# Patient Record
Sex: Female | Born: 1935 | Race: White | Hispanic: No | Smoking: Never smoker
Health system: Southern US, Community
[De-identification: ages and names within clinical notes are randomized; demographics above are authoritative.]

## PROBLEM LIST (undated history)

## (undated) DIAGNOSIS — I2510 Atherosclerotic heart disease of native coronary artery without angina pectoris: Secondary | ICD-10-CM

## (undated) DIAGNOSIS — E785 Hyperlipidemia, unspecified: Secondary | ICD-10-CM

## (undated) DIAGNOSIS — H9319 Tinnitus, unspecified ear: Secondary | ICD-10-CM

## (undated) DIAGNOSIS — H269 Unspecified cataract: Secondary | ICD-10-CM

## (undated) DIAGNOSIS — M79606 Pain in leg, unspecified: Secondary | ICD-10-CM

## (undated) DIAGNOSIS — K649 Unspecified hemorrhoids: Secondary | ICD-10-CM

## (undated) DIAGNOSIS — C801 Malignant (primary) neoplasm, unspecified: Secondary | ICD-10-CM

## (undated) DIAGNOSIS — E041 Nontoxic single thyroid nodule: Secondary | ICD-10-CM

## (undated) DIAGNOSIS — E039 Hypothyroidism, unspecified: Secondary | ICD-10-CM

## (undated) DIAGNOSIS — H3530 Unspecified macular degeneration: Secondary | ICD-10-CM

## (undated) DIAGNOSIS — K219 Gastro-esophageal reflux disease without esophagitis: Secondary | ICD-10-CM

## (undated) DIAGNOSIS — R51 Headache: Secondary | ICD-10-CM

## (undated) DIAGNOSIS — I8000 Phlebitis and thrombophlebitis of superficial vessels of unspecified lower extremity: Secondary | ICD-10-CM

## (undated) DIAGNOSIS — I209 Angina pectoris, unspecified: Secondary | ICD-10-CM

## (undated) DIAGNOSIS — J189 Pneumonia, unspecified organism: Secondary | ICD-10-CM

## (undated) DIAGNOSIS — J349 Unspecified disorder of nose and nasal sinuses: Secondary | ICD-10-CM

## (undated) DIAGNOSIS — M1990 Unspecified osteoarthritis, unspecified site: Secondary | ICD-10-CM

## (undated) DIAGNOSIS — J40 Bronchitis, not specified as acute or chronic: Secondary | ICD-10-CM

## (undated) DIAGNOSIS — K922 Gastrointestinal hemorrhage, unspecified: Secondary | ICD-10-CM

## (undated) DIAGNOSIS — F419 Anxiety disorder, unspecified: Secondary | ICD-10-CM

## (undated) DIAGNOSIS — R350 Frequency of micturition: Secondary | ICD-10-CM

## (undated) DIAGNOSIS — I10 Essential (primary) hypertension: Secondary | ICD-10-CM

## (undated) DIAGNOSIS — R29898 Other symptoms and signs involving the musculoskeletal system: Secondary | ICD-10-CM

## (undated) DIAGNOSIS — Z981 Arthrodesis status: Secondary | ICD-10-CM

## (undated) HISTORY — DX: Gastro-esophageal reflux disease without esophagitis: K21.9

## (undated) HISTORY — DX: Hyperlipidemia, unspecified: E78.5

## (undated) HISTORY — PX: HEMORRHOID SURGERY: SHX153

## (undated) HISTORY — PX: OTHER SURGICAL HISTORY: SHX169

## (undated) HISTORY — DX: Nontoxic single thyroid nodule: E04.1

## (undated) HISTORY — PX: LIPOMA EXCISION: SHX5283

## (undated) HISTORY — PX: UPPER GI ENDOSCOPY: SHX6162

---
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
## 1961-10-10 HISTORY — PX: OTHER SURGICAL HISTORY: SHX169

## 1964-10-10 DIAGNOSIS — Z981 Arthrodesis status: Secondary | ICD-10-CM

## 1964-10-10 HISTORY — DX: Arthrodesis status: Z98.1

## 1964-10-10 HISTORY — PX: CERVICAL FUSION: SHX112

## 1976-10-10 HISTORY — PX: CHOLECYSTECTOMY: SHX55

## 1976-10-10 HISTORY — PX: APPENDECTOMY: SHX54

## 2000-11-28 ENCOUNTER — Other Ambulatory Visit: Admission: RE | Admit: 2000-11-28 | Discharge: 2000-11-28 | Payer: Self-pay | Admitting: Obstetrics and Gynecology

## 2002-02-15 ENCOUNTER — Ambulatory Visit (HOSPITAL_COMMUNITY): Admission: RE | Admit: 2002-02-15 | Discharge: 2002-02-15 | Payer: Self-pay | Admitting: Endocrinology

## 2002-02-15 ENCOUNTER — Encounter: Payer: Self-pay | Admitting: Endocrinology

## 2002-05-03 ENCOUNTER — Encounter (INDEPENDENT_AMBULATORY_CARE_PROVIDER_SITE_OTHER): Payer: Self-pay | Admitting: Specialist

## 2002-05-03 ENCOUNTER — Encounter: Payer: Self-pay | Admitting: Endocrinology

## 2002-05-03 ENCOUNTER — Ambulatory Visit (HOSPITAL_COMMUNITY): Admission: RE | Admit: 2002-05-03 | Discharge: 2002-05-03 | Payer: Self-pay | Admitting: Endocrinology

## 2003-01-22 ENCOUNTER — Other Ambulatory Visit: Admission: RE | Admit: 2003-01-22 | Discharge: 2003-01-22 | Payer: Self-pay | Admitting: Obstetrics and Gynecology

## 2003-02-07 ENCOUNTER — Ambulatory Visit (HOSPITAL_COMMUNITY): Admission: RE | Admit: 2003-02-07 | Discharge: 2003-02-07 | Payer: Self-pay | Admitting: Gastroenterology

## 2003-02-10 ENCOUNTER — Ambulatory Visit (HOSPITAL_COMMUNITY): Admission: RE | Admit: 2003-02-10 | Discharge: 2003-02-10 | Payer: Self-pay | Admitting: Endocrinology

## 2003-02-10 ENCOUNTER — Encounter: Payer: Self-pay | Admitting: Endocrinology

## 2004-02-25 ENCOUNTER — Ambulatory Visit (HOSPITAL_COMMUNITY): Admission: RE | Admit: 2004-02-25 | Discharge: 2004-02-25 | Payer: Self-pay | Admitting: Endocrinology

## 2005-04-18 ENCOUNTER — Encounter (INDEPENDENT_AMBULATORY_CARE_PROVIDER_SITE_OTHER): Payer: Self-pay | Admitting: *Deleted

## 2005-04-19 ENCOUNTER — Inpatient Hospital Stay (HOSPITAL_COMMUNITY): Admission: RE | Admit: 2005-04-19 | Discharge: 2005-04-20 | Payer: Self-pay | Admitting: General Surgery

## 2005-05-20 ENCOUNTER — Other Ambulatory Visit: Admission: RE | Admit: 2005-05-20 | Discharge: 2005-05-20 | Payer: Self-pay | Admitting: Obstetrics and Gynecology

## 2006-03-22 ENCOUNTER — Ambulatory Visit (HOSPITAL_COMMUNITY): Admission: RE | Admit: 2006-03-22 | Discharge: 2006-03-22 | Payer: Self-pay | Admitting: Endocrinology

## 2006-08-18 ENCOUNTER — Encounter: Admission: RE | Admit: 2006-08-18 | Discharge: 2006-08-18 | Payer: Self-pay | Admitting: Obstetrics and Gynecology

## 2007-02-15 ENCOUNTER — Encounter: Admission: RE | Admit: 2007-02-15 | Discharge: 2007-02-15 | Payer: Self-pay | Admitting: Obstetrics and Gynecology

## 2007-03-23 ENCOUNTER — Encounter: Admission: RE | Admit: 2007-03-23 | Discharge: 2007-03-23 | Payer: Self-pay | Admitting: Endocrinology

## 2008-03-11 ENCOUNTER — Encounter: Admission: RE | Admit: 2008-03-11 | Discharge: 2008-03-11 | Payer: Self-pay | Admitting: Endocrinology

## 2009-05-13 ENCOUNTER — Encounter: Admission: RE | Admit: 2009-05-13 | Discharge: 2009-05-13 | Payer: Self-pay | Admitting: Endocrinology

## 2009-11-04 ENCOUNTER — Ambulatory Visit (HOSPITAL_COMMUNITY)
Admission: RE | Admit: 2009-11-04 | Discharge: 2009-11-04 | Payer: Self-pay | Source: Home / Self Care | Admitting: Obstetrics and Gynecology

## 2010-10-31 ENCOUNTER — Encounter: Payer: Self-pay | Admitting: Endocrinology

## 2011-02-25 NOTE — Op Note (Signed)
   NAME:  Sherry Olsen, Sherry Olsen                         ACCOUNT NO.:  [id number]   MEDICAL RECORD NO.:  [id number]                   PATIENT TYPE:  AMB   LOCATION:  ENDO                                 FACILITY:  [facility]   PHYSICIAN:  Bernette Redbird, M.D.                DATE OF BIRTH:  1933-10-09   DATE OF PROCEDURE:  DATE OF DISCHARGE:                                 OPERATIVE REPORT   PROCEDURE:  Colonoscopy.   INDICATIONS:  Strong family history of colon cancer in this 77 year old  female, whose father and maternal aunt both had colon cancer.  The patient  last had colonoscopy in [place] several years ago and in the past she has  had some small rectal polyps fulgurated, the histology being unknown since  no tissue was retrieved for histologic analysis.   FINDINGS:  Significant sigmoid diverticulosis, but no polyps.   DESCRIPTION OF PROCEDURE:  The risks of the procedure had been reviewed with  the patient, who provided written consent.  Sedation was fentanyl 80 mcg and  Versed 7 mg IV prior to and during the course of the procedure without  arrhythmias or desaturation.   The Olympus adjustable-tension pediatric video colonoscope was advanced  through an angulated, fixated sigmoid region with a lot of diverticular  disease, with mild to moderate difficulty, and then quite easily the  remainder of the way around the colon to the cecum, using external abdominal  compression to get the tip of the scope to advance into the base of the  cecum, whereupon pullback was initiated.  The quality of the prep was  excellent and it is felt that all areas were well-seen.   Apart from the above-mentioned sigmoid diverticular change and associated  fixation, this was a normal examination.  No polyps, cancer, or colitis was  observed.  There were no vascular malformations. Retroflexion in the rectum  as well as reinspection of the rectum was unremarkable.  No biopsies were  obtained.  The patient  tolerated the procedure well, and there were no  apparent complications.    IMPRESSION:  Sigmoid diverticulosis, otherwise negative colonoscopy in a  patient with a family history of colon cancer.   PLAN:  Screening colonoscopy again in five years.                                               Bernette Redbird, M.D.    RB/MEDQ  D:  02/07/2003  T:  02/07/2003  Job:  [id number]   cc:   Brooke Bonito, M.D.  [address]  [place]  [place] [address]  Fax: [phone]   Juluis Mire, M.D.  [address] [place]  [place] [address]  Fax: [phone]

## 2011-02-25 NOTE — Op Note (Signed)
NAME:  YEKATERINA, ESCUTIA NO.:  [id number]   MEDICAL RECORD NO.:  [id number]          PATIENT TYPE:  OIB   LOCATION:  2899                         FACILITY:  [facility]   PHYSICIAN:  Anselm Pancoast. Weatherly, M.D.DATE OF BIRTH:  [birth year]   DATE OF PROCEDURE:  04/18/2005  DATE OF DISCHARGE:                                 OPERATIVE REPORT   PREOPERATIVE DIAGNOSES:  1.  Internal and external hemorrhoids.  2.  Rectocele.  3.  Large cystocele.   OPERATION:  Internal and external hemorrhoidectomy.   ANESTHESIA:  General.   SURGEON:  Anselm Pancoast. Zachery Dakins, M.D.   HISTORY:  Ms. Jennea Rager is a 77 year old female who has long history of  kind of bowel irregularity, constipation, diarrhea, spastic colon-type  symptoms and has a pretty significant rectocele and cystocele.  She still  has her uterus and it is kind of retroflexed, and at some time she is going  to need a repair of the rectocele, cystocele, but she wants to proceed on  with an internal, external hemorrhoidectomy because of significant problems  with intermittent bleeding with these hemorrhoids that prolapse.  I saw her  in the office and it was certainly much more disease than any type of  internal or office procedure could manage.  Recommended we proceed on with a  hemorrhoidectomy.  She is aware that the rectocele and cystocele is not  being addressed at this time.  She is followed by Dr. Richardean Chimera who has  seen her in the past week and am not sure what or when they are planning the  gyn procedure.  Preoperatively, the patient because of her multiple  allergies was given a gram of vancomycin and after this was completed, she  was taken to the operative suite.   She has had a previous complete neck fusion and I think because of the  rectocele and cystocele that it is necessary that she be completely  paralyzed and not an LOA tube, so it was some time getting the tube by the  anesthesiologist.  They used a  fiberoptic and other tricks, and then kind of  a rapid intubation exchanging the tubes, etc.  Their portion is noted on the  anesthesia record.  After the good airway had been established, she was then  placed up into the candy cane stirrups. I was very careful not to let her  hips be overflexed or well protected with pillows, etc., and the ankles and  feet protected with towels.  In this position, the hemorrhoids prolapsed out  significantly, and we prepped her including a vaginal prep with Betadine  scrub, saline, and then also Betadine solution.  Next, she was draped in the  sterile manner with the legs, etc.  Next, after first inspecting I went  ahead and anesthetized the perianal area with approximately 20 mL of 0.5%  Marcaine with adrenaline.  I cautiously dilated her anus.  She does not  really have an anal stenosis and I did not want to dilate her up too much  because of her laxity of the whole floor, etc.  In this position, I was  sitting with an operative tray in my lap.  Went ahead and did a careful  inspection and elected to do the anterior right internal and external  hemorrhoid complex first taking real care not to go up real high and making  sure that all the stitches were definitely in the mucosa and not a deep  stitch that could possibly head into vaginal area because of the very thin  rectal wall.  Then, the hemorrhoid had been elevated with Buie clamp.  The  internal sphincter was visualized, but not injured.  The hemorrhoid removed.  A little superficial external hemorrhoid area was sutured with 3-0 Chromic,  and then the hemorrhoidectomy sutured over the clamp a couple of U stitches  were then placed first of 2-0 Chromic in the external portion of the  hemorrhoidectomy was running closed with a suture.  Next, a similar right  posterior lateral complex was elevated in a similar manner, and this was  sutured using the Buie clamp, and then removing the Buie clamp fairly  early.  Good hemostasis, and then locking running sutures of the internal and  external portion.  The left lateral hemorrhoid was the smaller one, and it  was elevated off the sphincter in the similar manner.  There is a little bit  of excess tissue right in the posterior external, but I wanted to leave that  because of difficulty healing if you remove that because of the closeness of  the two lateral incisions.  The suture line was inspected.  Good hemostasis.  2% Xylocaine ointment over gauze corner was placed in the anal canal, and  then 4 x 4's over it and held in place with stretch panties.  I plan to keep  her overnight and do an in and out catheterization if there is any  difficulty in voiding, and then release her in the morning.  I inspected her  at the completion of surgery, inspection of her vagina, and could not feel  any of the suture lines, etc.       WJW/MEDQ  D:  04/18/2005  T:  04/18/2005  Job:  [id number]   cc:   Juluis Mire, M.D.  [address] [place]  [place] [address]  Fax: [phone]  [address]  [place]  [place] [address]  Fax: [phone]

## 2012-01-31 ENCOUNTER — Other Ambulatory Visit (HOSPITAL_COMMUNITY): Payer: Self-pay | Admitting: Endocrinology

## 2012-01-31 DIAGNOSIS — E041 Nontoxic single thyroid nodule: Secondary | ICD-10-CM

## 2012-02-03 ENCOUNTER — Ambulatory Visit (HOSPITAL_COMMUNITY)
Admission: RE | Admit: 2012-02-03 | Discharge: 2012-02-03 | Disposition: A | Discharge status: Home / Self Care | Payer: Medicare Other | Source: Ambulatory Visit | Attending: Endocrinology | Admitting: Endocrinology

## 2012-02-03 DIAGNOSIS — E042 Nontoxic multinodular goiter: Secondary | ICD-10-CM | POA: Insufficient documentation

## 2012-02-03 DIAGNOSIS — E041 Nontoxic single thyroid nodule: Secondary | ICD-10-CM

## 2012-05-01 ENCOUNTER — Other Ambulatory Visit (HOSPITAL_COMMUNITY): Payer: Self-pay | Admitting: Endocrinology

## 2012-05-01 DIAGNOSIS — E041 Nontoxic single thyroid nodule: Secondary | ICD-10-CM

## 2012-05-03 ENCOUNTER — Other Ambulatory Visit: Payer: Self-pay | Admitting: Radiology

## 2012-05-07 ENCOUNTER — Ambulatory Visit (HOSPITAL_COMMUNITY)
Admission: RE | Admit: 2012-05-07 | Discharge: 2012-05-07 | Disposition: A | Discharge status: Home / Self Care | Payer: Medicare Other | Source: Ambulatory Visit | Attending: Endocrinology | Admitting: Endocrinology

## 2012-05-07 DIAGNOSIS — E041 Nontoxic single thyroid nodule: Secondary | ICD-10-CM | POA: Insufficient documentation

## 2012-05-07 NOTE — Procedures (Signed)
Increasing size of left thyroid nodule. Last biopsy performed 2003. Not responding to synthroid.   Procedure : US guided 25 g FNA x 3 of dominant left thyroid nodule.  Complications : small amount of bleeding around nodule - controlled with pressure and ice. Patient tolerated well with no impact on swallowing or airway compromise.   Patient informed to not take her 81 mg asa x 2 days and to continue ice packs, slight pressure today and tomorrow. Full report in canopy.

## 2012-06-19 ENCOUNTER — Encounter (INDEPENDENT_AMBULATORY_CARE_PROVIDER_SITE_OTHER): Payer: Self-pay | Admitting: General Surgery

## 2012-06-21 ENCOUNTER — Encounter (INDEPENDENT_AMBULATORY_CARE_PROVIDER_SITE_OTHER): Payer: Self-pay | Admitting: General Surgery

## 2012-06-21 ENCOUNTER — Encounter (INDEPENDENT_AMBULATORY_CARE_PROVIDER_SITE_OTHER): Payer: Self-pay

## 2012-06-21 ENCOUNTER — Ambulatory Visit (INDEPENDENT_AMBULATORY_CARE_PROVIDER_SITE_OTHER): Payer: Medicare Other | Admitting: General Surgery

## 2012-06-21 VITALS — BP 132/72 | HR 80 | Temp 98.6°F | Resp 18 | Ht 61.0 in | Wt 133.0 lb

## 2012-06-21 DIAGNOSIS — E042 Nontoxic multinodular goiter: Secondary | ICD-10-CM

## 2012-06-21 NOTE — Patient Instructions (Signed)
You have multiple thyroid nodules, and most likely you have a benign, nontoxic multinodular goiter.  Because of the large nodule on the left, and because it has slowly enlarged, the recommendation is elective surgery to have the left thyroid lobe and isthmus removed. We would remove the right lobe only if cancer were proven.that might require a second operation at a later date.  You'll be scheduled for left thyroid lobectomy in the near future.    Thyroidectomy Thyroidectomy is the removal of part or all of your thyroid gland. Your thyroid gland is a butterfly-shaped gland at the base of your neck. It produces a substance called thyroid hormone, which regulates the physical and chemical processes that keep your body functioning and make energy available to your body (metabolism). The amount of thyroid gland tissue that is removed during a thyroidectomy depends on the reason for the procedure. Typically, if only a part of your gland is removed, enough thyroid gland tissue remains to maintain normal function. If your entire thyroid gland is removed or if the amount of thyroid gland tissue remaining is inadequate to maintain normal function, you will need life-long treatment with thyroid hormone on a daily basis. Thyroidectomy maybe performed when you have the following conditions:  Thyroid nodules. These are small, abnormal collections of tissue that form inside the thyroid gland. If these nodules begin to enlarge at a rapid rate, a sample of tissue from the nodule is taken through a needle and examined (needle biopsy). This is done to determine if the nodules are cancerous. Depending on the outcome of this exam, thyroidectomy may be necessary.   Thyroid cancer.   Goiter, which is an enlarged thyroid gland. All or part of the thyroid gland may be removed if the gland has become so large that it causes difficulty breathing or swallowing.   Hyperthyroidism. This is when the thyroid gland produces too  much thyroid hormone. Hypothyroidism can cause symptoms of fluctuating weight, intolerance to heat, irritability, shortness of breath, and chest pain.  LET YOUR CAREGIVER KNOW ABOUT:   Allergies to food or medicine.   Medicines that you are taking, including vitamins, herbs, eyedrops, over-the-counter medicines, and creams.   Previous problems you have had with anesthetics or numbing medicines.   History of bleeding problems or blood clots.   Previous surgeries you have had.   Other health problems, including diabetes and kidney problems, you have had.   Possibility of pregnancy, if this applies.  BEFORE THE PROCEDURE   Do not eat or drink anything, including water, for at least 6 hours before the procedure.   Ask your caregiver whether you should stop taking certain medicines before the day of the procedure.  PROCEDURE  There are different ways that thyroidectomy is performed. For each type, you will be given a medicine to make you sleep (general anesthetic). The three main types of thyroidectomy are listed as follows:  Conventional thyroidectomy-A cut (incision) in the center portion of your lower neck is made with a scalpel. Muscles below your skin are separated to gain access to your thyroid gland. Your thyroid gland is dissected from your windpipe (trachea). Often a drain is placed at the incision site to drain any blood that accumulates under the skin after the procedure. This drain will be removed before you go home. The wound from the incision should heal within 2 weeks.   Endoscopic thyroidectomy-Small incisions are made in your lower neck. A small instrument (endoscope) is inserted under your skin at the  incision sites. The endoscope used for thyroidectomy consists of 2 flexible tubes. Inside one of the tubes is a video camera that is used to guide the Careers adviser. Tools to remove the thyroid gland, including a tool to cut the gland (dissectors) and a suction device, are inserted  through the other tube. The surgeon uses the dissectors to dissect the thyroid gland from the trachea and remove it.   Robotic thyroidectomy-This procedure allows your thyroid gland to be removed through incisions in your armpit, your chest, or high in your neck. Instruments similar to endoscopes provide a 3-dimensional picture of the surgical site. Dissecting instruments are controlled by devices similar to joysticks. These devices allow more accurate manipulation of the instruments. After the blood supply to the gland is removed, the gland is cut into several pieces and removed through the incisions.  RISKS AND COMPLICATIONS Complications associated with thyroidectomy are rare, but they can occur. Possible complications include:  A decrease in parathyroid hormone levels (hypoparathyroidism)-Your parathyroid glands are located close behind your thyroid gland. They are responsible for maintaining calcium levels inthe body. If they are damaged or removed, levels of calcium in the blood become low and nerves become irritable, which can cause muscle spasms. Medicines are available to treat this.   Bacterial infection-This can often be treated with medicines that kill bacteria (antibiotics).   Damage to your voice box nerves-This could cause hoarseness or complete loss of voice.   Bleeding or airway obstruction.  AFTER THE PROCEDURE   You will rest in the recovery room as you wake up.   When you first wake up, your throat may feel slightly sore.   You will not be allowed to eat or drink until instructed otherwise.   You will be taken to your hospital room. You will usually stay at the hospital for 1 or 2 nights.   If a drain is placed during the procedure, it usually is removed the next day.   You may have some mild neck pain.   Your voice may be weak. This usually is temporary.  Document Released: 03/22/2001 Document Revised: 09/15/2011 Document Reviewed: 12/29/2010 [facility] Patient  Information 2012 [place], [place].

## 2012-06-21 NOTE — Progress Notes (Signed)
Patient ID: Sherry Olsen, female   DOB: 1933/06/13, 78 y.o.   MRN: [id number]  Chief Complaint  Patient presents with  . Thyroid Nodule    HPI Sherry Olsen is a 78 y.o. female.  She is referred by Dr. Adela Lank because of multiple thyroid nodules, and a dominant, enlarging thyroid nodule on the left that is 4.9 cm.  The patient states she has been told she had a goiter since she was a child. She says that a [place] chiropractor painted  iodine on her neck when she was a child. She was evaluated for thyroid disease in 1992 at [facility] clinic but nothing further was done. She denies history of radiation therapy to the neck. She had fine-needle aspiration in 2003. She's had a cervical fusion from the axis to C4.  She is followed by Dr. Juleen China and Dr. Arelia Sneddon. Ultrasound was performed on 02/03/2012 and compared to an ultrasound performed 11/06/2009,  she has multiple bilateral nodules. The largest nodule was on the left side and this 49 x 35 mm about 10% increase in size. The isthmus contains an 18 mm nodule. The nodules on the right are small. Thyroid function tests and chemistries are normal.she has no pressure symptoms, voice change, or swallowing problems.  Ultrasound guided FNA of the left thyroid nodule performed on July 29 shows follicular epithelium. Considerations were hyperplasia and adenoma. There was no atypia.  Family history is negative for thyroid cancer or MEN syndromes.  She is pretty healthy. She has irritable bowel syndrome and occasionally takes MiraLAX. She is on Synthroid. She takes Crestor for hyperlipidemia. HPI  Past Medical History  Diagnosis Date  . Thyroid nodule   . Hyperlipidemia   . GERD (gastroesophageal reflux disease)     Past Surgical History  Procedure Date  . Hemorrhoid surgery   . Cervical fusion   . Cholecystectomy   . Hemorrhoid surgery   . Tonsillectomy     No family history on file.  Social History History  Substance Use Topics  .  Smoking status: Never Smoker   . Smokeless tobacco: Not on file  . Alcohol Use: Yes    Allergies  Allergen Reactions  . Penicillins Anaphylaxis  . Ciprocinonide (Fluocinolone) Hives  . Fosamax (Alendronate Sodium)   . Keflex (Cephalexin)   . Sulfa Antibiotics Hives  . Tetracyclines & Related     Current Outpatient Prescriptions  Medication Sig Dispense Refill  . acetaminophen (TYLENOL) 500 MG tablet Take 1,000 mg by mouth every 6 (six) hours as needed. For pain      . aspirin 81 MG chewable tablet Chew 81 mg by mouth every morning.      Marland Kitchen atorvastatin (LIPITOR) 10 MG tablet Take 5 mg by mouth every morning.      . calcitonin, salmon, (MIACALCIN/FORTICAL) 200 UNIT/ACT nasal spray Place 1 spray into the nose daily.      . calcium carbonate (TUMS EX) 750 MG chewable tablet Chew 1 tablet by mouth as needed. For indigestion      . Calcium Citrate-Vitamin D (CITRACAL + D PO) Take 2 tablets by mouth 2 (two) times daily.      . Carboxymethylcell-Hypromellose (GENTEAL) 0.25-0.3 % GEL Apply 1 drop to eye at bedtime.      . cycloSPORINE (RESTASIS) 0.05 % ophthalmic emulsion Place 1 drop into both eyes 2 (two) times daily.      Marland Kitchen dicyclomine (BENTYL) 10 MG capsule Take 10 mg by mouth every morning.      Marland Kitchen  fish oil-omega-3 fatty acids 1000 MG capsule Take 1 g by mouth daily.      . lansoprazole (PREVACID) 30 MG capsule Take 60 mg by mouth every morning.      Marland Kitchen levothyroxine (SYNTHROID, LEVOTHROID) 75 MCG tablet Take 75 mcg by mouth daily before breakfast.      . OVER THE COUNTER MEDICATION Take 1 tablet by mouth every morning. Ocular Nutrition with Lutein eye vitamin      . Polyethylene Glycol 3350 (MIRALAX PO) Take by mouth. Confirm frequency with patient.        Review of Systems Review of Systems  Constitutional: Negative for fever, chills and unexpected weight change.  HENT: Negative for hearing loss, congestion, sore throat, trouble swallowing, neck pain and voice change.   Eyes:  Negative for visual disturbance.  Respiratory: Negative for cough and wheezing.   Cardiovascular: Negative for chest pain, palpitations and leg swelling.  Gastrointestinal: Negative for nausea, vomiting, abdominal pain, diarrhea, constipation, blood in stool, abdominal distention and anal bleeding.  Genitourinary: Negative for hematuria, vaginal bleeding and difficulty urinating.  Musculoskeletal: Negative for arthralgias.  Skin: Negative for rash and wound.  Neurological: Negative for seizures, syncope and headaches.  Hematological: Negative for adenopathy. Does not bruise/bleed easily.  Psychiatric/Behavioral: Negative for confusion.    Blood pressure 132/72, pulse 80, temperature 98.6 F (37 C), resp. rate 18, height 5\' 1"  (1.549 m), weight 133 lb (60.328 kg).  Physical Exam Physical Exam  Constitutional: She is oriented to person, place, and time. She appears well-developed and well-nourished. No distress.  HENT:  Head: Normocephalic and atraumatic.  Nose: Nose normal.  Mouth/Throat: No oropharyngeal exudate.  Eyes: Conjunctivae normal and EOM are normal. Pupils are equal, round, and reactive to light. Left eye exhibits no discharge. No scleral icterus.  Neck: Neck supple. No JVD present. No tracheal deviation present. No thyromegaly present.       Posterior neck incision from spinal fusion. Thyroid exam reveals fullness on the left but no discrete nodules, no goiter. No adenopathy.  Cardiovascular: Normal rate, regular rhythm, normal heart sounds and intact distal pulses.   No murmur heard. Pulmonary/Chest: Effort normal and breath sounds normal. No respiratory distress. She has no wheezes. She has no rales. She exhibits no tenderness.  Abdominal: Soft. Bowel sounds are normal. She exhibits no distension and no mass. There is no tenderness. There is no rebound and no guarding.  Musculoskeletal: She exhibits no edema and no tenderness.  Lymphadenopathy:    She has no cervical  adenopathy.  Neurological: She is alert and oriented to person, place, and time. She exhibits normal muscle tone. Coordination normal.  Skin: Skin is warm. No rash noted. She is not diaphoretic. No erythema. No pallor.  Psychiatric: She has a normal mood and affect. Her behavior is normal. Judgment and thought content normal.    Data Reviewed Dr. Marylen Ponto notes. Blood test. Ultrasound. Cytopathology.  Assessment    4.9 cm left thyroid nodule with cellular cytopathology. Most likely this is a manifestation of a benign, nontoxic multinodular goiter. There is a low definite risk of a well differentiated malignancy simply because of the dimensions of the nodule. Elective left thyroid lobectomy is recommended as the standard of care in this setting.  History multilevel cervical fusion  Irritable bowel syndrome    Plan    We talked about this for a long time. They understand the risk versus benefits of the surgery. She has decided to go ahead and schedule surgery  I  discussed the indications, details, techniques, and numerous risks of left thyroid lobectomy with the patient and her husband. I told him we would remove the isthmus at the same time since there seems to be a nodule there. We will leave the right lobe in place unless there is histologic confirmation of malignancy. They're aware that she may need a completion thyroidectomy if that is the case but most likely this will turn out to be benign they understand these issues. Their questions were answered. They agree with this plan.       [name]. Derrell Lolling, M.D., [facility] General and Minimally invasive Surgery Breast and Colorectal Surgery Office:   [phone] Pager:   [phone]  06/21/2012, 10:49 AM

## 2012-07-30 ENCOUNTER — Encounter (HOSPITAL_COMMUNITY): Payer: Self-pay | Admitting: Pharmacy Technician

## 2012-08-02 ENCOUNTER — Ambulatory Visit (HOSPITAL_COMMUNITY)
Admission: RE | Admit: 2012-08-02 | Discharge: 2012-08-02 | Disposition: A | Discharge status: Home / Self Care | Payer: Medicare Other | Source: Ambulatory Visit | Attending: General Surgery | Admitting: General Surgery

## 2012-08-02 ENCOUNTER — Encounter (HOSPITAL_COMMUNITY): Payer: Self-pay

## 2012-08-02 ENCOUNTER — Encounter (HOSPITAL_COMMUNITY)
Admission: RE | Admit: 2012-08-02 | Discharge: 2012-08-02 | Disposition: A | Discharge status: Home / Self Care | Payer: Medicare Other | Source: Ambulatory Visit | Attending: General Surgery | Admitting: General Surgery

## 2012-08-02 DIAGNOSIS — Z01812 Encounter for preprocedural laboratory examination: Secondary | ICD-10-CM | POA: Insufficient documentation

## 2012-08-02 DIAGNOSIS — E049 Nontoxic goiter, unspecified: Secondary | ICD-10-CM | POA: Insufficient documentation

## 2012-08-02 DIAGNOSIS — Z01818 Encounter for other preprocedural examination: Secondary | ICD-10-CM | POA: Insufficient documentation

## 2012-08-02 HISTORY — DX: Unspecified hemorrhoids: K64.9

## 2012-08-02 HISTORY — DX: Unspecified cataract: H26.9

## 2012-08-02 HISTORY — DX: Unspecified macular degeneration: H35.30

## 2012-08-02 HISTORY — DX: Arthrodesis status: Z98.1

## 2012-08-02 HISTORY — DX: Other symptoms and signs involving the musculoskeletal system: R29.898

## 2012-08-02 HISTORY — DX: Phlebitis and thrombophlebitis of superficial vessels of unspecified lower extremity: I80.00

## 2012-08-02 HISTORY — DX: Hypothyroidism, unspecified: E03.9

## 2012-08-02 HISTORY — DX: Pneumonia, unspecified organism: J18.9

## 2012-08-02 HISTORY — DX: Frequency of micturition: R35.0

## 2012-08-02 HISTORY — DX: Gastrointestinal hemorrhage, unspecified: K92.2

## 2012-08-02 HISTORY — DX: Headache: R51

## 2012-08-02 HISTORY — DX: Unspecified osteoarthritis, unspecified site: M19.90

## 2012-08-02 HISTORY — DX: Unspecified disorder of nose and nasal sinuses: J34.9

## 2012-08-02 LAB — CBC
HCT: 44 % (ref 36.0–46.0)
Hemoglobin: 14.4 g/dL (ref 12.0–15.0)
WBC: 5.9 10*3/uL (ref 4.0–10.5)

## 2012-08-02 LAB — BASIC METABOLIC PANEL
BUN: 6 mg/dL (ref 6–23)
Chloride: 99 mEq/L (ref 96–112)
GFR calc Af Amer: >90 mL/min (ref >90)
Glucose, Bld: 123 mg/dL — ABNORMAL HIGH (ref 70–99)
Potassium: 4 mEq/L (ref 3.5–5.1)

## 2012-08-02 LAB — SURGICAL PCR SCREEN: Staphylococcus aureus: POSITIVE — AB

## 2012-08-02 NOTE — Pre-Procedure Instructions (Signed)
PT'S ANESTHESIA RECORD FROM [facility] 2006 SURGERY ON PT'S CHART.

## 2012-08-02 NOTE — Patient Instructions (Addendum)
YOUR SURGERY IS SCHEDULED AT [facility]  ON:   Wednesday  10/30  AT 8:30 AM  REPORT TO [place] SHORT STAY CENTER AT: 6:30 AM      PHONE # FOR SHORT STAY IS [phone]  STOP ASPIRIN, HERBALS, BLOOD THINNERS 5 DAYS BEFORE SURGERY.  DO NOT EAT OR DRINK ANYTHING AFTER MIDNIGHT THE NIGHT BEFORE YOUR SURGERY.  YOU MAY BRUSH YOUR TEETH, RINSE OUT YOUR MOUTH--BUT NO WATER, NO FOOD, NO CHEWING GUM, NO MINTS, NO CANDIES, NO CHEWING TOBACCO.  PLEASE TAKE THE FOLLOWING MEDICATIONS THE AM OF YOUR SURGERY WITH A FEW SIPS OF WATER:  BENTYL, PREVACID, SYNTHROID.  MAY USE CALCITONIN NASAL SPRAY AND RESTASIS EYE DROPS---MAY BRING ANY EYE DROPS / GELS AND CALCITONIN NASAL SPRAY TO THE HOSPITAL AND CHECK WITH YOUR NURSE AFTER SURGERY BEFORE USING.   IF YOU USE INHALERS--USE YOUR INHALERS THE AM OF YOUR SURGERY AND BRING INHALERS TO THE HOSPITAL -TAKE TO SURGERY.    IF YOU ARE DIABETIC:  DO NOT TAKE ANY DIABETIC MEDICATIONS THE AM OF YOUR SURGERY.  IF YOU TAKE INSULIN IN THE EVENINGS--PLEASE ONLY TAKE 1/2 NORMAL EVENING DOSE THE NIGHT BEFORE YOUR SURGERY.  NO INSULIN THE AM OF YOUR SURGERY.  IF YOU HAVE SLEEP APNEA AND USE CPAP OR BIPAP--PLEASE BRING THE MASK AND THE TUBING.  DO NOT BRING YOUR MACHINE.  DO NOT BRING VALUABLES, MONEY, CREDIT CARDS.  DO NOT WEAR JEWELRY, MAKE-UP, NAIL POLISH AND NO METAL PINS OR CLIPS IN YOUR HAIR. CONTACT LENS, DENTURES / PARTIALS, GLASSES SHOULD NOT BE WORN TO SURGERY AND IN MOST CASES-HEARING AIDS WILL NEED TO BE REMOVED.  BRING YOUR GLASSES CASE, ANY EQUIPMENT NEEDED FOR YOUR CONTACT LENS. FOR PATIENTS ADMITTED TO THE HOSPITAL--CHECK OUT TIME THE DAY OF DISCHARGE IS 11:00 AM.  ALL INPATIENT ROOMS ARE PRIVATE - WITH BATHROOM, TELEPHONE, TELEVISION AND WIFI INTERNET.  IF YOU ARE BEING DISCHARGED THE SAME DAY OF YOUR SURGERY--YOU CAN NOT DRIVE YOURSELF HOME--AND SHOULD NOT GO HOME ALONE BY TAXI OR BUS.  NO DRIVING OR OPERATING MACHINERY FOR 24 HOURS FOLLOWING  ANESTHESIA / PAIN MEDICATIONS.  PLEASE MAKE ARRANGEMENTS FOR SOMEONE TO BE WITH YOU AT HOME THE FIRST 24 HOURS AFTER SURGERY. RESPONSIBLE DRIVER'S NAME___________________________                                               PHONE #   _______________________                                  PLEASE READ OVER ANY  FACT SHEETS THAT YOU WERE GIVEN: MRSA INFORMATION, BLOOD TRANSFUSION INFORMATION, INCENTIVE SPIROMETER INFORMATION.

## 2012-08-02 NOTE — Pre-Procedure Instructions (Signed)
PREOP CBC, BMET, EKG, CXR WERE DONE TODAY AT [facility] AS PER ANESTHESIOLOGIST'S GUIDELINES. DR. ROSE NOTIFIED OF PT'S LETTER FROM [facility] -DIFFICULT INTUBATION 2006 HEMORRHOIDECTOMY SURGERY--ANESTHESIA RECORD ON MICROFILM AND HAS BEEN REQUESTED FROM MEDICAL RECORDS.  DR. ROSE DID COME TO SEE PT TODAY.

## 2012-08-07 NOTE — H&P (Signed)
Sherry Olsen  MRN: [id number]   Description: 78 year old female  Provider: [name], MD  Department: [facility]       Diagnoses     Multiple thyroid nodules   - Primary    241.1        Vitals    BP Pulse Temp Resp Ht Wt    132/72 80 98.6 F (37 C) 18 5\' 1"  (1.549 m) 133 lb (60.328 kg)    BMI - 25.13 kg/m2                 History and Physical   [name], MD  Patient ID: Sherry Olsen, female   DOB: 1933/06/30, 78 y.o.   MRN: [id number]           HPI Sherry Olsen is a 78 y.o. female.  She is referred by Dr. Adela Lank because of multiple thyroid nodules, and a dominant, enlarging thyroid nodule on the left that is 4.9 cm.   The patient states she has been told she had a goiter since she was a child. She says that a [place] chiropractor painted  iodine on her neck when she was a child. She was evaluated for thyroid disease in 1992 at [facility] clinic but nothing further was done. She denies history of radiation therapy to the neck. She had fine-needle aspiration in 2003. She's had a cervical fusion from the axis to C4.   She is followed by Dr. Juleen China and Dr. Arelia Sneddon. Ultrasound was performed on 02/03/2012 and compared to an ultrasound performed 11/06/2009,  she has multiple bilateral nodules. The largest nodule was on the left side and this 49 x 35 mm about 10% increase in size. The isthmus contains an 18 mm nodule. The nodules on the right are small. Thyroid function tests and chemistries are normal.she has no pressure symptoms, voice change, or swallowing problems.   Ultrasound guided FNA of the left thyroid nodule performed on July 29 shows follicular epithelium. Considerations were hyperplasia and adenoma. There was no atypia.   Family history is negative for thyroid cancer or MEN syndromes.   She is pretty healthy. She has irritable bowel syndrome and occasionally takes MiraLAX. She is on Synthroid. She takes Crestor for  hyperlipidemia.     Past Medical History   Diagnosis  Date   .  Thyroid nodule     .  Hyperlipidemia     .  GERD (gastroesophageal reflux disease)         Past Surgical History   Procedure  Date   .  Hemorrhoid surgery     .  Cervical fusion     .  Cholecystectomy     .  Hemorrhoid surgery     .  Tonsillectomy        No family history on file.   Social History History   Substance Use Topics   .  Smoking status:  Never Smoker    .  Smokeless tobacco:  Not on file   .  Alcohol Use:  Yes       Allergies   Allergen  Reactions   .  Penicillins  Anaphylaxis   .  Ciprocinonide (Fluocinolone)  Hives   .  Fosamax (Alendronate Sodium)     .  Keflex (Cephalexin)     .  Sulfa Antibiotics  Hives   .  Tetracyclines & Related         Current Outpatient Prescriptions  Medication  Sig  Dispense  Refill   .  acetaminophen (TYLENOL) 500 MG tablet  Take 1,000 mg by mouth every 6 (six) hours as needed. For pain         .  aspirin 81 MG chewable tablet  Chew 81 mg by mouth every morning.         Marland Kitchen  atorvastatin (LIPITOR) 10 MG tablet  Take 5 mg by mouth every morning.         .  calcitonin, salmon, (MIACALCIN/FORTICAL) 200 UNIT/ACT nasal spray  Place 1 spray into the nose daily.         .  calcium carbonate (TUMS EX) 750 MG chewable tablet  Chew 1 tablet by mouth as needed. For indigestion         .  Calcium Citrate-Vitamin D (CITRACAL + D PO)  Take 2 tablets by mouth 2 (two) times daily.         .  Carboxymethylcell-Hypromellose (GENTEAL) 0.25-0.3 % GEL  Apply 1 drop to eye at bedtime.         .  cycloSPORINE (RESTASIS) 0.05 % ophthalmic emulsion  Place 1 drop into both eyes 2 (two) times daily.         Marland Kitchen  dicyclomine (BENTYL) 10 MG capsule  Take 10 mg by mouth every morning.         .  fish oil-omega-3 fatty acids 1000 MG capsule  Take 1 g by mouth daily.         .  lansoprazole (PREVACID) 30 MG capsule  Take 60 mg by mouth every morning.         Marland Kitchen  levothyroxine (SYNTHROID,  LEVOTHROID) 75 MCG tablet  Take 75 mcg by mouth daily before breakfast.         .  OVER THE COUNTER MEDICATION  Take 1 tablet by mouth every morning. Ocular Nutrition with Lutein eye vitamin         .  Polyethylene Glycol 3350 (MIRALAX PO)  Take by mouth. Confirm frequency with patient.            Review of Systems   Constitutional: Negative for fever, chills and unexpected weight change.  HENT: Negative for hearing loss, congestion, sore throat, trouble swallowing, neck pain and voice change.   Eyes: Negative for visual disturbance.  Respiratory: Negative for cough and wheezing.   Cardiovascular: Negative for chest pain, palpitations and leg swelling.  Gastrointestinal: Negative for nausea, vomiting, abdominal pain, diarrhea, constipation, blood in stool, abdominal distention and anal bleeding.  Genitourinary: Negative for hematuria, vaginal bleeding and difficulty urinating.  Musculoskeletal: Negative for arthralgias.  Skin: Negative for rash and wound.  Neurological: Negative for seizures, syncope and headaches.  Hematological: Negative for adenopathy. Does not bruise/bleed easily.  Psychiatric/Behavioral: Negative for confusion.    Blood pressure 132/72, pulse 80, temperature 98.6 F (37 C), resp. rate 18, height 5\' 1"  (1.549 m), weight 133 lb (60.328 kg).   Physical Exam   Constitutional: She is oriented to person, place, and time. She appears well-developed and well-nourished. No distress.  HENT:   Head: Normocephalic and atraumatic.   Nose: Nose normal.   Mouth/Throat: No oropharyngeal exudate.  Eyes: Conjunctivae normal and EOM are normal. Pupils are equal, round, and reactive to light. Left eye exhibits no discharge. No scleral icterus.  Neck: Neck supple. No JVD present. No tracheal deviation present. No thyromegaly present.       Posterior neck incision from spinal fusion. Thyroid exam  reveals fullness on the left but no discrete nodules, no goiter. No adenopathy.    Cardiovascular: Normal rate, regular rhythm, normal heart sounds and intact distal pulses.    No murmur heard. Pulmonary/Chest: Effort normal and breath sounds normal. No respiratory distress. She has no wheezes. She has no rales. She exhibits no tenderness.  Abdominal: Soft. Bowel sounds are normal. She exhibits no distension and no mass. There is no tenderness. There is no rebound and no guarding.  Musculoskeletal: She exhibits no edema and no tenderness.  Lymphadenopathy:    She has no cervical adenopathy.  Neurological: She is alert and oriented to person, place, and time. She exhibits normal muscle tone. Coordination normal.  Skin: Skin is warm. No rash noted. She is not diaphoretic. No erythema. No pallor.  Psychiatric: She has a normal mood and affect. Her behavior is normal. Judgment and thought content normal.    Data Reviewed Dr. Marylen Ponto notes. Blood test. Ultrasound. Cytopathology.   Assessment 4.9 cm left thyroid nodule with cellular cytopathology. Most likely this is a manifestation of a benign, nontoxic multinodular goiter. There is a low definite risk of a well differentiated malignancy simply because of the dimensions of the nodule. Elective left thyroid lobectomy is recommended as the standard of care in this setting.   History multilevel cervical fusion   Irritable bowel syndrome   Plan We talked about this for a long time. They understand the risk versus benefits of the surgery. She has decided to go ahead and schedule surgery   I discussed the indications, details, techniques, and numerous risks of left thyroid lobectomy with the patient and her husband. I told him we would remove the isthmus at the same time since there seems to be a nodule there. We will leave the right lobe in place unless there is histologic confirmation of malignancy. They're aware that she may need a completion thyroidectomy if that is the case but most likely this will turn out to be benign  they understand these issues. Their questions were answered. They agree with this plan.       [name]. Derrell Lolling, M.D., [facility] General and Minimally invasive Surgery Breast and Colorectal Surgery Office:   [phone] Pager:   [phone]

## 2012-08-08 ENCOUNTER — Encounter (HOSPITAL_COMMUNITY): Payer: Self-pay | Admitting: *Deleted

## 2012-08-08 ENCOUNTER — Encounter (HOSPITAL_COMMUNITY): Payer: Self-pay | Admitting: Anesthesiology

## 2012-08-08 ENCOUNTER — Ambulatory Visit (HOSPITAL_COMMUNITY): Payer: Medicare Other | Admitting: Anesthesiology

## 2012-08-08 ENCOUNTER — Encounter (HOSPITAL_COMMUNITY)
Admission: RE | Disposition: A | Discharge status: Home / Self Care | Payer: Self-pay | Source: Ambulatory Visit | Attending: General Surgery

## 2012-08-08 ENCOUNTER — Observation Stay (HOSPITAL_COMMUNITY)
Admission: RE | Admit: 2012-08-08 | Discharge: 2012-08-09 | Disposition: A | Discharge status: Home / Self Care | Payer: Medicare Other | Source: Ambulatory Visit | Attending: General Surgery | Admitting: General Surgery

## 2012-08-08 DIAGNOSIS — Z79899 Other long term (current) drug therapy: Secondary | ICD-10-CM | POA: Insufficient documentation

## 2012-08-08 DIAGNOSIS — Z7982 Long term (current) use of aspirin: Secondary | ICD-10-CM | POA: Insufficient documentation

## 2012-08-08 DIAGNOSIS — E042 Nontoxic multinodular goiter: Principal | ICD-10-CM | POA: Insufficient documentation

## 2012-08-08 DIAGNOSIS — E785 Hyperlipidemia, unspecified: Secondary | ICD-10-CM | POA: Insufficient documentation

## 2012-08-08 DIAGNOSIS — K219 Gastro-esophageal reflux disease without esophagitis: Secondary | ICD-10-CM | POA: Insufficient documentation

## 2012-08-08 DIAGNOSIS — Z981 Arthrodesis status: Secondary | ICD-10-CM | POA: Insufficient documentation

## 2012-08-08 DIAGNOSIS — E039 Hypothyroidism, unspecified: Secondary | ICD-10-CM | POA: Insufficient documentation

## 2012-08-08 DIAGNOSIS — C73 Malignant neoplasm of thyroid gland: Secondary | ICD-10-CM

## 2012-08-08 HISTORY — PX: THYROID LOBECTOMY: SHX420

## 2012-08-08 SURGERY — LOBECTOMY, THYROID
Anesthesia: General | Site: Neck | Laterality: Left | Wound class: Clean

## 2012-08-08 MED ORDER — ROCURONIUM BROMIDE 100 MG/10ML IV SOLN
INTRAVENOUS | Status: DC | PRN
  Administered 2012-08-08: 25 mg via INTRAVENOUS

## 2012-08-08 MED ORDER — OXYCODONE HCL 5 MG/5ML PO SOLN
5.0000 mg | Freq: Once | ORAL | Status: DC | PRN
  Filled 2012-08-08: qty 5

## 2012-08-08 MED ORDER — HYDROMORPHONE HCL PF 1 MG/ML IJ SOLN
INTRAMUSCULAR | Status: AC
  Filled 2012-08-08: qty 1

## 2012-08-08 MED ORDER — BUPIVACAINE-EPINEPHRINE PF 0.25-1:200000 % IJ SOLN
INTRAMUSCULAR | Status: AC
  Filled 2012-08-08: qty 30

## 2012-08-08 MED ORDER — ACETAMINOPHEN 10 MG/ML IV SOLN: 1000.0000 mg | Freq: Once | INTRAVENOUS | Status: DC | PRN

## 2012-08-08 MED ORDER — EPHEDRINE SULFATE 50 MG/ML IJ SOLN
INTRAMUSCULAR | Status: DC | PRN
  Administered 2012-08-08: 10 mg via INTRAVENOUS

## 2012-08-08 MED ORDER — NEOSTIGMINE METHYLSULFATE 1 MG/ML IJ SOLN
INTRAMUSCULAR | Status: DC | PRN
  Administered 2012-08-08: 3 mg via INTRAVENOUS

## 2012-08-08 MED ORDER — LIDOCAINE HCL (CARDIAC) 20 MG/ML IV SOLN
INTRAVENOUS | Status: DC | PRN
  Administered 2012-08-08: 50 mg via INTRAVENOUS

## 2012-08-08 MED ORDER — PANTOPRAZOLE SODIUM 40 MG PO TBEC
40.0000 mg | DELAYED_RELEASE_TABLET | Freq: Every day | ORAL | Status: DC
  Administered 2012-08-09: 40 mg via ORAL
  Filled 2012-08-08 (×3): qty 1

## 2012-08-08 MED ORDER — ONDANSETRON HCL 4 MG/2ML IJ SOLN: 4.0000 mg | Freq: Four times a day (QID) | INTRAMUSCULAR | Status: DC | PRN

## 2012-08-08 MED ORDER — POLYETHYLENE GLYCOL 3350 17 G PO PACK
8.5000 g | PACK | Freq: Every day | ORAL | Status: DC
  Administered 2012-08-08: 8.5 g via ORAL
  Filled 2012-08-08 (×2): qty 1

## 2012-08-08 MED ORDER — LORATADINE 10 MG PO TABS
10.0000 mg | ORAL_TABLET | Freq: Every day | ORAL | Status: DC
  Filled 2012-08-08 (×2): qty 1

## 2012-08-08 MED ORDER — VANCOMYCIN HCL IN DEXTROSE 1-5 GM/200ML-% IV SOLN
1000.0000 mg | INTRAVENOUS | Status: AC
  Administered 2012-08-08: 1000 mg via INTRAVENOUS

## 2012-08-08 MED ORDER — ATORVASTATIN CALCIUM 10 MG PO TABS
5.0000 mg | ORAL_TABLET | Freq: Every day | ORAL | Status: DC
  Administered 2012-08-08: 5 mg via ORAL
  Filled 2012-08-08 (×2): qty 0.5

## 2012-08-08 MED ORDER — HEPARIN SODIUM (PORCINE) 5000 UNIT/ML IJ SOLN
5000.0000 [IU] | Freq: Three times a day (TID) | INTRAMUSCULAR | Status: DC
  Administered 2012-08-09: 5000 [IU] via SUBCUTANEOUS
  Filled 2012-08-08 (×4): qty 1

## 2012-08-08 MED ORDER — CALCIUM CARBONATE-VITAMIN D 500-200 MG-UNIT PO TABS
2.0000 | ORAL_TABLET | Freq: Two times a day (BID) | ORAL | Status: DC
  Administered 2012-08-08 – 2012-08-09 (×2): 2 via ORAL
  Filled 2012-08-08 (×4): qty 2

## 2012-08-08 MED ORDER — HYDROMORPHONE HCL PF 1 MG/ML IJ SOLN
0.2500 mg | INTRAMUSCULAR | Status: DC | PRN
  Administered 2012-08-08 (×5): 0.25 mg via INTRAVENOUS
  Administered 2012-08-08: 0.5 mg via INTRAVENOUS
  Administered 2012-08-08: 0.25 mg via INTRAVENOUS

## 2012-08-08 MED ORDER — ACETAMINOPHEN 10 MG/ML IV SOLN
INTRAVENOUS | Status: AC
  Filled 2012-08-08: qty 100

## 2012-08-08 MED ORDER — CALCIUM CARBONATE ANTACID 750 MG PO CHEW: 500.0000 mg | CHEWABLE_TABLET | ORAL | Status: DC | PRN

## 2012-08-08 MED ORDER — GLYCOPYRROLATE 0.2 MG/ML IJ SOLN
INTRAMUSCULAR | Status: DC | PRN
  Administered 2012-08-08: 0.4 mg via INTRAVENOUS

## 2012-08-08 MED ORDER — CYCLOSPORINE 0.05 % OP EMUL
1.0000 [drp] | Freq: Two times a day (BID) | OPHTHALMIC | Status: DC
  Administered 2012-08-08: 1 [drp] via OPHTHALMIC
  Filled 2012-08-08 (×3): qty 1

## 2012-08-08 MED ORDER — ONDANSETRON HCL 4 MG PO TABS: 4.0000 mg | ORAL_TABLET | Freq: Four times a day (QID) | ORAL | Status: DC | PRN

## 2012-08-08 MED ORDER — BUPIVACAINE-EPINEPHRINE PF 0.25-1:200000 % IJ SOLN
INTRAMUSCULAR | Status: DC | PRN
  Administered 2012-08-08: 30 mL

## 2012-08-08 MED ORDER — 0.9 % SODIUM CHLORIDE (POUR BTL) OPTIME
TOPICAL | Status: DC | PRN
  Administered 2012-08-08: 1000 mL

## 2012-08-08 MED ORDER — LACTATED RINGERS IV SOLN
INTRAVENOUS | Status: DC | PRN
  Administered 2012-08-08 (×2): via INTRAVENOUS

## 2012-08-08 MED ORDER — LEVOTHYROXINE SODIUM 75 MCG PO TABS
75.0000 ug | ORAL_TABLET | Freq: Every day | ORAL | Status: DC
  Administered 2012-08-09: 75 ug via ORAL
  Filled 2012-08-08 (×2): qty 1

## 2012-08-08 MED ORDER — CALCIUM CARBONATE ANTACID 500 MG PO CHEW
400.0000 mg | CHEWABLE_TABLET | Freq: Three times a day (TID) | ORAL | Status: DC | PRN
  Administered 2012-08-08 (×2): 400 mg via ORAL
  Filled 2012-08-08: qty 2

## 2012-08-08 MED ORDER — MIDAZOLAM HCL 5 MG/5ML IJ SOLN
INTRAMUSCULAR | Status: DC | PRN
  Administered 2012-08-08: 2 mg via INTRAVENOUS

## 2012-08-08 MED ORDER — SIMETHICONE 125 MG PO CAPS: 1.0000 | ORAL_CAPSULE | Freq: Every day | ORAL | Status: DC | PRN

## 2012-08-08 MED ORDER — MORPHINE SULFATE 2 MG/ML IJ SOLN
2.0000 mg | INTRAMUSCULAR | Status: DC | PRN
  Administered 2012-08-08 (×3): 2 mg via INTRAVENOUS
  Filled 2012-08-08 (×3): qty 1

## 2012-08-08 MED ORDER — VANCOMYCIN HCL IN DEXTROSE 1-5 GM/200ML-% IV SOLN
INTRAVENOUS | Status: AC
  Filled 2012-08-08: qty 200

## 2012-08-08 MED ORDER — HYDROCODONE-ACETAMINOPHEN 5-325 MG PO TABS
1.0000 | ORAL_TABLET | ORAL | Status: DC | PRN
  Administered 2012-08-08: 2 via ORAL
  Administered 2012-08-08: 1 via ORAL
  Administered 2012-08-09: 2 via ORAL
  Filled 2012-08-08 (×2): qty 2
  Filled 2012-08-08: qty 1

## 2012-08-08 MED ORDER — CALCITONIN (SALMON) 200 UNIT/ACT NA SOLN
1.0000 | Freq: Every day | NASAL | Status: DC
  Filled 2012-08-08: qty 3.7

## 2012-08-08 MED ORDER — SIMETHICONE 80 MG PO CHEW
80.0000 mg | CHEWABLE_TABLET | Freq: Every day | ORAL | Status: DC | PRN
  Filled 2012-08-08: qty 1

## 2012-08-08 MED ORDER — CALCIUM CARBONATE-VITAMIN D 500-400 MG-UNIT PO TABS: 2.0000 | ORAL_TABLET | Freq: Two times a day (BID) | ORAL | Status: DC

## 2012-08-08 MED ORDER — POTASSIUM CHLORIDE IN NACL 20-0.9 MEQ/L-% IV SOLN
INTRAVENOUS | Status: DC
  Administered 2012-08-08 (×2): via INTRAVENOUS
  Filled 2012-08-08 (×4): qty 1000

## 2012-08-08 MED ORDER — CARBOXYMETHYLCELL-HYPROMELLOSE 0.25-0.3 % OP GEL: 1.0000 [drp] | Freq: Every day | OPHTHALMIC | Status: DC

## 2012-08-08 MED ORDER — ONDANSETRON HCL 4 MG/2ML IJ SOLN
INTRAMUSCULAR | Status: DC | PRN
  Administered 2012-08-08: 4 mg via INTRAVENOUS

## 2012-08-08 MED ORDER — SUCCINYLCHOLINE CHLORIDE 20 MG/ML IJ SOLN
INTRAMUSCULAR | Status: DC | PRN
  Administered 2012-08-08: 100 mg via INTRAVENOUS

## 2012-08-08 MED ORDER — POLYVINYL ALCOHOL 1.4 % OP SOLN
1.0000 [drp] | Freq: Every day | OPHTHALMIC | Status: DC
  Filled 2012-08-08: qty 15

## 2012-08-08 MED ORDER — OXYCODONE HCL 5 MG PO TABS: 5.0000 mg | ORAL_TABLET | Freq: Once | ORAL | Status: DC | PRN

## 2012-08-08 MED ORDER — DICYCLOMINE HCL 10 MG PO CAPS
10.0000 mg | ORAL_CAPSULE | Freq: Every morning | ORAL | Status: DC
  Administered 2012-08-09: 10 mg via ORAL
  Filled 2012-08-08 (×2): qty 1

## 2012-08-08 MED ORDER — FENTANYL CITRATE 0.05 MG/ML IJ SOLN
INTRAMUSCULAR | Status: DC | PRN
  Administered 2012-08-08: 100 ug via INTRAVENOUS
  Administered 2012-08-08: 50 ug via INTRAVENOUS
  Administered 2012-08-08: 100 ug via INTRAVENOUS

## 2012-08-08 MED ORDER — DEXAMETHASONE SODIUM PHOSPHATE 10 MG/ML IJ SOLN
INTRAMUSCULAR | Status: DC | PRN
  Administered 2012-08-08: 10 mg via INTRAVENOUS

## 2012-08-08 MED ORDER — PROPOFOL 10 MG/ML IV BOLUS
INTRAVENOUS | Status: DC | PRN
  Administered 2012-08-08: 150 mg via INTRAVENOUS

## 2012-08-08 MED ORDER — MEPERIDINE HCL 50 MG/ML IJ SOLN: 6.2500 mg | INTRAMUSCULAR | Status: DC | PRN

## 2012-08-08 MED ORDER — PROMETHAZINE HCL 25 MG/ML IJ SOLN: 6.2500 mg | INTRAMUSCULAR | Status: DC | PRN

## 2012-08-08 SURGICAL SUPPLY — 42 items
ADH SKN CLS APL DERMABOND .7 (GAUZE/BANDAGES/DRESSINGS) ×2
APL SKNCLS STERI-STRIP NONHPOA (GAUZE/BANDAGES/DRESSINGS)
ATTRACTOMAT 16X20 MAGNETIC DRP (DRAPES) ×2 IMPLANT
BENZOIN TINCTURE PRP APPL 2/3 (GAUZE/BANDAGES/DRESSINGS) ×1 IMPLANT
BLADE HEX COATED 2.75 (ELECTRODE) ×2 IMPLANT
BLADE SURG 15 STRL LF DISP TIS (BLADE) ×1 IMPLANT
BLADE SURG 15 STRL SS (BLADE) ×2
CANISTER SUCTION 2500CC (MISCELLANEOUS) ×2 IMPLANT
CHLORAPREP W/TINT 10.5 ML (MISCELLANEOUS) ×2 IMPLANT
CLIP TI MEDIUM 6 (CLIP) ×4 IMPLANT
CLIP TI WIDE RED SMALL 6 (CLIP) ×4 IMPLANT
CLOTH BEACON ORANGE TIMEOUT ST (SAFETY) ×2 IMPLANT
DERMABOND ADVANCED (GAUZE/BANDAGES/DRESSINGS) ×2
DERMABOND ADVANCED .7 DNX12 (GAUZE/BANDAGES/DRESSINGS) IMPLANT
DISSECTOR ROUND CHERRY 3/8 STR (MISCELLANEOUS) ×1 IMPLANT
DRAPE PED LAPAROTOMY (DRAPES) ×2 IMPLANT
DRESSING SURGICEL FIBRLLR 1X2 (HEMOSTASIS) ×1 IMPLANT
DRSG SURGICEL FIBRILLAR 1X2 (HEMOSTASIS) ×2
ELECT REM PT RETURN 9FT ADLT (ELECTROSURGICAL) ×2
ELECTRODE REM PT RTRN 9FT ADLT (ELECTROSURGICAL) ×1 IMPLANT
GAUZE SPONGE 4X4 16PLY XRAY LF (GAUZE/BANDAGES/DRESSINGS) ×3 IMPLANT
GLOVE EUDERMIC 7 POWDERFREE (GLOVE) ×2 IMPLANT
GOWN STRL NON-REIN LRG LVL3 (GOWN DISPOSABLE) ×1 IMPLANT
GOWN STRL REIN XL XLG (GOWN DISPOSABLE) ×5 IMPLANT
KIT BASIN OR (CUSTOM PROCEDURE TRAY) ×2 IMPLANT
NS IRRIG 1000ML POUR BTL (IV SOLUTION) ×2 IMPLANT
PACK BASIC VI WITH GOWN DISP (CUSTOM PROCEDURE TRAY) ×2 IMPLANT
PENCIL BUTTON HOLSTER BLD 10FT (ELECTRODE) ×2 IMPLANT
SHEARS HARMONIC 9CM CVD (BLADE) ×2 IMPLANT
SPONGE GAUZE 4X4 12PLY (GAUZE/BANDAGES/DRESSINGS) IMPLANT
STAPLER VISISTAT 35W (STAPLE) ×2 IMPLANT
STRIP CLOSURE SKIN 1/2X4 (GAUZE/BANDAGES/DRESSINGS) ×1 IMPLANT
SUT MNCRL AB 4-0 PS2 18 (SUTURE) ×2 IMPLANT
SUT SILK 2 0 (SUTURE) ×2
SUT SILK 2-0 18XBRD TIE 12 (SUTURE) ×1 IMPLANT
SUT SILK 3 0 (SUTURE) ×2
SUT SILK 3-0 18XBRD TIE 12 (SUTURE) IMPLANT
SUT VIC AB 3-0 SH 18 (SUTURE) ×2 IMPLANT
SYR BULB IRRIGATION 50ML (SYRINGE) ×2 IMPLANT
TOWEL OR 17X26 10 PK STRL BLUE (TOWEL DISPOSABLE) ×2 IMPLANT
TOWEL OR NON WOVEN STRL DISP B (DISPOSABLE) ×1 IMPLANT
YANKAUER SUCT BULB TIP 10FT TU (MISCELLANEOUS) ×2 IMPLANT

## 2012-08-08 NOTE — Anesthesia Preprocedure Evaluation (Addendum)
Anesthesia Evaluation  Patient identified by MRN, date of birth, ID band Patient awake    Reviewed: Allergy & Precautions, H&P , NPO status , Patient's Chart, lab work & pertinent test results  History of Anesthesia Complications (+) DIFFICULT AIRWAY  Airway Mallampati: III TM Distance: >3 FB Neck ROM: Limited    Dental  (+) Dental Advisory Given and Teeth Intact   Pulmonary pneumonia -, resolved,  breath sounds clear to auscultation  Pulmonary exam normal       Cardiovascular Exercise Tolerance: Good - CAD Rhythm:Regular Rate:Normal     Neuro/Psych  Headaches, negative psych ROS   GI/Hepatic Neg liver ROS, GERD-  Medicated,  Endo/Other  Hypothyroidism   Renal/GU negative Renal ROS     Musculoskeletal negative musculoskeletal ROS (+)   Abdominal   Peds  Hematology negative hematology ROS (+)   Anesthesia Other Findings   Reproductive/Obstetrics                         Anesthesia Physical Anesthesia Plan  ASA: III  Anesthesia Plan: General   Post-op Pain Management:    Induction: Intravenous  Airway Management Planned: Oral ETT and Video Laryngoscope Planned  Additional Equipment:   Intra-op Plan:   Post-operative Plan: Extubation in OR  Informed Consent: I have reviewed the patients History and Physical, chart, labs and discussed the procedure including the risks, benefits and alternatives for the proposed anesthesia with the patient or authorized representative who has indicated his/her understanding and acceptance.   Dental advisory given  Plan Discussed with: CRNA  Anesthesia Plan Comments:        Anesthesia Quick Evaluation

## 2012-08-08 NOTE — Progress Notes (Signed)
Went to see regular doctor Friday 08/03/2012. was not feeling well started on Zyrtec. Feeling much better.

## 2012-08-08 NOTE — Op Note (Signed)
Patient Name:           HALSTON FAIRCLOUGH   Date of Surgery:        08/08/2012  Pre op Diagnosis:      Dominant nodule left thyroid lobe, multinodular goiter  Post op Diagnosis:    Same  Procedure:                 Left thyroid lobectomy  Surgeon:                     [name]. Derrell Lolling, M.D., FACS  Assistant:                      Glenna Fellows, M.D., FACS  Operative Indications:   KETINA MARS is a 78 y.o. female. She was referred by Dr. Adela Lank because of multiple thyroid nodules, and a dominant, enlarging thyroid nodule on the left that is 4.9 cm.  The patient states she has been told she had a goiter since she was a child. She says that a [place] chiropractor painted iodine on her neck when she was a child. She was evaluated for thyroid disease in 1992 at [facility] clinic but nothing further was done. She denies history of radiation therapy to the neck. She had fine-needle aspiration in 2003. She's had a cervical fusion from the axis to C4.    She is followed by Dr. Juleen China and Dr. Arelia Sneddon. Ultrasound was performed on 02/03/2012 and compared to an ultrasound performed 11/06/2009, she has multiple bilateral nodules. The largest nodule was on the left side and this 49 x 35 mm about 10% increase in size. The isthmus contains an 18 mm nodule. The nodules on the right are small. Thyroid function tests and chemistries are normal.she has no pressure symptoms, voice change, or swallowing problems.  Ultrasound guided FNA of the left thyroid nodule performed on July 29 shows follicular epithelium. Considerations were hyperplasia and adenoma. There was no atypia.  Family history is negative for thyroid cancer or MEN syndromes.Because of the size of the dominant nodule on the left and its progressive enlargement, left thyroid lobectomy was recommended. She is brought to the operating room electively   Operative Findings:       There was a large, but relatively smooth and soft nodule filling the mid and  lower portions of the left thyroid lobe. On the left side of the isthmus was a 2 cm firm smooth adenomatous nodule.. The tissues were chronically fibrotic and the tissue planes were a little bit sticky, raising the question of thyroiditis or possibly chronic inflammation from previous needle biopsies. There was no adenopathy and there was no gross evidence of malignancy. The left recurrent laryngeal nerve was well seen and preserved. The left superior parathyroid gland was well seen and preserved. We did not clearly identify the left inferior parathyroid gland.  Procedure in Detail:          Following the induction of general endotracheal anesthesia the patient was positioned with her neck slightly extended and in a slight reverse Trendelenburg position. The neck was prepped and draped in a sterile fashion. Surgical time out was performed. Intravenous antibiotics were given. 0.5% Marcaine with epinephrine was used as a local infiltration anesthetic into the subcutaneous tissues. A curved transverse incision was made low in the neck. Dissection was carried down through the subcutaneous tissue and the platysma muscles. Skin and platysma flaps were raised superiorly and inferiorly a self-retaining retractor  was placed. There was one large vein in the midline which had to be clamped divided and ligated with Vicryl ties. The strap muscles were divided in the midline and exploration was carried out with findings as described above. We slowly dissected the strap muscles off of the left thyroid lobe. We isolated the superior pole vessels and controlled them with metal clips and divided with harmonic scalpel. During this dissection we identified and preserved the left superior parathyroid gland. We kept the dissection out laterally and inferiorly in the capsule of the thyroid gland until we could completely  mobilize the large left lobe containing the large mass. We continued the dissection laterally and identified the  recurrent laryngeal nerve. We carefully dissected the thyroid gland up off of the trachea and carefully divided the ligament of Berry and that mobilized the thyroid well away from the recurrent laryngeal nerve. The inferior thyroid artery was controlled with metal clips and divided. We did not appear to have injured or resected the inferior parathyroid gland but because of the fibrosis were not sure that we had seen it. We continued dissecting the left  lobe across the midline to the right of the nodule in the isthmus. The isthmus on the right side was very thin and was divided with the harmonic scalpel. The specimen was removed and sent for permanent histology. Hemostasis was excellent it had been achieved with the harmonic scalpel and metal clips a little bit of cautery. We irrigated the wound out. I placed some fibrillar hemostatic sponge into the wound and waited for 5 minutes and saw that there was no bleeding. We then closed the strap muscles in the midline with interrupted 3-0 Vicryl sutures. We closed the platysma muscle with interrupted sutures of 3-0 Vicryl. We closed the skin with a running subcuticular suture 4-0 Monocryl and Dermabond. Patient tolerated procedure well without complications. She was taken to recovery in stable condition. EBL 20 cc or less. Counts correct.     [name]. Derrell Lolling, M.D., FACS General and Minimally Invasive Surgery Breast and Colorectal Surgery  08/08/2012 9:56 AM

## 2012-08-08 NOTE — Interval H&P Note (Signed)
History and Physical Interval Note:  08/08/2012 8:09 AM  Sherry Olsen  has presented today for surgery, with the diagnosis of thyroid nodule  The goals and the various methods of treatment have been discussed with the patient and family. After consideration of risks, benefits and other options for treatment, the patient has consented to  Procedure(s) (LRB) with comments: THYROID LOBECTOMY (Left) - Left Thyroid Lobectomy as a surgical intervention .  The patient's history has been reviewed, patient examined today, no change in status, stable for surgery.  I have reviewed the patient's chart and labs.  Questions were answered to the patient's satisfaction.     [name]

## 2012-08-08 NOTE — Anesthesia Postprocedure Evaluation (Signed)
Anesthesia Post Note  Patient: Sherry Olsen  Procedure(s) Performed: Procedure(s) (LRB): THYROID LOBECTOMY (Left)  Anesthesia type: General  Patient location: PACU  Post pain: Pain level controlled  Post assessment: Post-op Vital signs reviewed  Last Vitals: BP 160/71  Pulse 79  Temp 36.5 C (Oral)  Resp 12  Ht 5\' 1"  (1.549 m)  Wt 135 lb (61.236 kg)  BMI 25.51 kg/m2  SpO2 99%  Post vital signs: Reviewed  Level of consciousness: sedated  Complications: No apparent anesthesia complications

## 2012-08-08 NOTE — Transfer of Care (Signed)
Immediate Anesthesia Transfer of Care Note  Patient: Sherry Olsen  Procedure(s) Performed: Procedure(s) (LRB) with comments: THYROID LOBECTOMY (Left) - Left Thyroid Lobectomy  Patient Location: PACU  Anesthesia Type:General  Level of Consciousness: awake and alert   Airway & Oxygen Therapy: Patient Spontanous Breathing and Patient connected to face mask oxygen  Post-op Assessment: Report given to PACU RN and Post -op Vital signs reviewed and stable  Post vital signs: Reviewed and stable  Complications: No apparent anesthesia complications

## 2012-08-08 NOTE — Anesthesia Procedure Notes (Addendum)
Procedure Name: Intubation Date/Time: 08/08/2012 8:26 AM Performed by: [place], Helmer Dull C Pre-anesthesia Checklist: Patient identified, Timeout performed, Emergency Drugs available, Suction available and Patient being monitored Patient Re-evaluated:Patient Re-evaluated prior to inductionOxygen Delivery Method: Circle system utilized Preoxygenation: Pre-oxygenation with 100% oxygen Intubation Type: Combination inhalational/ intravenous induction Grade View: Grade I Tube type: Oral Tube size: 7.5 mm Number of attempts: 1 Airway Equipment and Method: Rigid stylet and Video-laryngoscopy Placement Confirmation: ETT inserted through vocal cords under direct vision,  breath sounds checked- equal and bilateral,  positive ETCO2 and CO2 detector Secured at: 21 cm Tube secured with: Tape Dental Injury: Teeth and Oropharynx as per pre-operative assessment  Difficulty Due To: Difficulty was anticipated Comments: Pt with a known history of difficult intubation.  Mac 3 via Glidescope performed by S. [place]. Grade I View, 7.5 ETT with rigid stylet passed via VC. Smooth and easy.

## 2012-08-09 ENCOUNTER — Encounter (HOSPITAL_COMMUNITY): Payer: Self-pay | Admitting: General Surgery

## 2012-08-09 MED ORDER — HYDROCODONE-ACETAMINOPHEN 5-325 MG PO TABS: 1.0000 | ORAL_TABLET | ORAL | Status: DC | PRN

## 2012-08-09 NOTE — Care Management Note (Signed)
    Page 1 of 1   08/09/2012     11:11:43 AM   CARE MANAGEMENT NOTE 08/09/2012  Patient:  Sherry Olsen, Sherry Olsen   Account Number:  [id number]  Date Initiated:  08/09/2012  Documentation initiated by:  Lorenda Ishihara  Subjective/Objective Assessment:   78 yo admitted s/p thyroid lobectomy     Action/Plan:   Anticipated DC Date:  08/09/2012   Anticipated DC Plan:  HOME/SELF CARE      DC Planning Services  CM consult      Choice offered to / List presented to:             Status of service:  Completed, signed off Medicare Important Message given?   (If response is "NO", the following Medicare IM given date fields will be blank) Date Medicare IM given:   Date Additional Medicare IM given:    Discharge Disposition:  HOME/SELF CARE  Per UR Regulation:  Reviewed for med. necessity/level of care/duration of stay  If discussed at Long Length of Stay Meetings, dates discussed:    Comments:

## 2012-08-09 NOTE — Progress Notes (Signed)
Patient discharged via wheelchair. Per patient pauline RN gave her RX for norco this am. States understanding of discharge papers. States has been dizzy with ambulation after pain meds since yesterday, b/p stable, spoke to assistant director Lelon Perla, agrees to teaching patient safety precautions and patient states understanding. Patient states she gets this way at home, but has been worse after anesthesia and pain meds but husband will be home for assistance.

## 2012-08-09 NOTE — Progress Notes (Signed)
1 Day Post-Op  Subjective: Alert. Pain well controlled with pain medication. No nausea. Coughing a little. Throat a little sore. Swallowing without difficulty. Voice strong. In good spirits.No paresthesias.  Objective: Vital signs in last 24 hours: Temp:  [95.5 F (35.3 C)-98.5 F (36.9 C)] 98.2 F (36.8 C) (10/31 0601) Pulse Rate:  [74-99] 92  (10/31 0601) Resp:  [10-27] 18  (10/31 0601) BP: (138-163)/(61-83) 138/74 mmHg (10/31 0601) SpO2:  [94 %-100 %] 95 % (10/31 0601) Weight:  [135 lb (61.236 kg)] 135 lb (61.236 kg) (10/30 1157) Last BM Date: 08/08/12  Intake/Output from previous day: 10/30 0701 - 10/31 0700 In: 2798.3 [P.O.:120; I.V.:2678.3] Out: 1250 [Urine:1250] Intake/Output this shift: Total I/O In: 1578.3 [I.V.:1578.3] Out: 1200 [Urine:1200]      Exam: General appearance: alert. Mental status normal. In no distress. Skin warm and dry. Neck: Neck incision looks good. Minimal bruising. No swelling. No hematoma. Chvostek's sign  negative bilaterally.    Lab Results:  No results found for this or any previous visit (from the past 24 hour(s)).   Studies/Results: @RISRSLT24 @     . atorvastatin  5 mg Oral q1800  . calcitonin (salmon)  1 spray Alternating Nares Daily  . calcium-vitamin D  2 tablet Oral BID  . cycloSPORINE  1 drop Both Eyes BID  . dicyclomine  10 mg Oral q morning - 10a  . heparin  5,000 Units Subcutaneous Q8H  . HYDROmorphone      . HYDROmorphone      . levothyroxine  75 mcg Oral QAC breakfast  . loratadine  10 mg Oral Daily  . pantoprazole  40 mg Oral Daily  . polyethylene glycol  8.5 g Oral QHS  . polyvinyl alcohol  1 drop Both Eyes QHS  . vancomycin  1,000 mg Intravenous 120 min pre-op  . DISCONTD: calcium-vitamin D  2 tablet Oral BID  . DISCONTD: Carboxymethylcell-Hypromellose  1 drop Ophthalmic QHS     Assessment/Plan: s/p Procedure(s): THYROID LOBECTOMY  POD #1. Left thyroid lobectomy. Doing well. No apparent surgical  complications. Meets discharge criteria. Discharge home today. We'll call pathology report the patient tomorrow or Monday Diet and activities discussed Return to office in approximately 3 weeks.    LOS: 1 day    Sherry Olsen M. Derrell Lolling, M.D., [facility] General and Minimally invasive Surgery Breast and Colorectal Surgery Office:   [phone] Pager:   [phone]   08/09/2012  . .prob

## 2012-08-09 NOTE — Progress Notes (Signed)
Per Dr Derrell Lolling   prescription  for pain given to patient.

## 2012-08-09 NOTE — Discharge Summary (Signed)
Patient ID: Sherry Olsen [id number] 79 y.o. 1932-12-01  08/08/2012  Discharge date and time: August 09, 2012  Admitting Physician: [name]  Discharge Physician: [name]  Admission Diagnoses: thyroid nodule  Discharge Diagnoses: Multinodular goiter, dominant left thyroid lobe nodule  Operations: Procedure(s): THYROID LOBECTOMY  Admission Condition: good  Discharged Condition: good  Indication for Admission: Sherry Olsen is a 78 y.o. female. She is referred by Dr. Adela Lank because of multiple thyroid nodules, and a dominant, enlarging thyroid nodule on the left that is 4.9 cm.  The patient states she has been told she had a goiter since she was a child. She says that a [place] chiropractor painted iodine on her neck when she was a child. She was evaluated for thyroid disease in 1992 at [facility] clinic but nothing further was done. She denies history of radiation therapy to the neck. She had fine-needle aspiration in 2003. She's had a cervical fusion from the axis to C4.  She is followed by Dr. Juleen China and Dr. Arelia Sneddon. Ultrasound was performed on 02/03/2012 and compared to an ultrasound performed 11/06/2009, she has multiple bilateral nodules. The largest nodule was on the left side and this 49 x 35 mm about 10% increase in size. The isthmus contains an 18 mm nodule. The nodules on the right are small. Thyroid function tests and chemistries are normal. She is on synthroid.  She has no pressure symptoms, voice change, or swallowing problems.  Ultrasound guided FNA of the left thyroid nodule performed on July 29 shows follicular epithelium. Considerations were hyperplasia and adenoma. There was no atypia.  Family history is negative for thyroid cancer or MEN syndromes.   Hospital Course: On the day of admission the patient was taken to the operating room and underwent a left thyroid lobectomy. She had a large smooth soft nodule filling most of the lower pole of the  left thyroid lobe. She also had a firm smooth benign-appearing nodule of the isthmus. Both of these were removed and the surgery was uneventful. On postop day one she was doing well. Voice was strong. No  swallowing problems. No bleeding or swelling. She was discharged home on postop day 1. She was advised pathology report will be  called to her either tomorrow or the following Monday. She will followup with Dr. Derrell Lolling in 3 weeks. She is advised to stay on her regular medications including Synthroid. She was given a prescription for Vicodin for pain.  Consults: None  Significant Diagnostic Studies: None  Treatments: surgery: Left thyroid lobectomy  Disposition: Home  Patient Instructions:   Sherry Olsen, Sherry Olsen  Home Medication Instructions NWG:[id number]   Printed on:08/09/12 0636  Medication Information                    aspirin 81 MG chewable tablet Chew 81 mg by mouth every other day.            fish oil-omega-3 fatty acids 1000 MG capsule Take 1 g by mouth 2 (two) times daily.            cycloSPORINE (RESTASIS) 0.05 % ophthalmic emulsion Place 1 drop into both eyes 2 (two) times daily.           Carboxymethylcell-Hypromellose (GENTEAL) 0.25-0.3 % GEL Apply 1 drop to eye at bedtime.           atorvastatin (LIPITOR) 10 MG tablet Take 5 mg by mouth every evening.  OVER THE COUNTER MEDICATION Take 2 tablets by mouth 2 (two) times daily. Ocular Nutrition with Lutein eye vitamin           acetaminophen (TYLENOL) 500 MG tablet Take 500 mg by mouth every 6 (six) hours as needed. For pain           levothyroxine (SYNTHROID, LEVOTHROID) 75 MCG tablet Take 75 mcg by mouth daily before breakfast.           dicyclomine (BENTYL) 10 MG capsule Take 10 mg by mouth every morning.           lansoprazole (PREVACID) 30 MG capsule Take 30 mg by mouth every morning.            calcium carbonate (TUMS EX) 750 MG chewable tablet Chew 1 tablet by mouth as needed. For indigestion            calcitonin, salmon, (MIACALCIN/FORTICAL) 200 UNIT/ACT nasal spray Place 1 spray into the nose daily.           calcium-vitamin D (CALCIUM 500+D HIGH POTENCY) 500-400 MG-UNIT per tablet Take 2 tablets by mouth 2 (two) times daily.           Simethicone (GAS-X EXTRA STRENGTH) 125 MG CAPS Take 1 capsule by mouth daily as needed. For indigestion           polyethylene glycol (MIRALAX / GLYCOLAX) packet Take 8.5 g by mouth daily.           aspirin 81 MG tablet Take 81 mg by mouth. EVERY OTHER DAY           cetirizine (ZYRTEC) 10 MG tablet Take 10 mg by mouth daily.           HYDROcodone-acetaminophen (NORCO/VICODIN) 5-325 MG per tablet Take 1-2 tablets by mouth every 4 (four) hours as needed for pain.             Activity: activity as tolerated Diet: regular diet Wound Care: none needed  Follow-up:  With Dr. Derrell Lolling in 3 weeks.  Signed: [name]. Derrell Lolling, M.D., FACS General and minimally invasive surgery Breast and Colorectal Surgery  08/09/2012, 6:36 AM

## 2012-08-13 ENCOUNTER — Telehealth (INDEPENDENT_AMBULATORY_CARE_PROVIDER_SITE_OTHER): Payer: Self-pay | Admitting: General Surgery

## 2012-08-13 NOTE — Telephone Encounter (Signed)
Pathology report was returned this afternoon of her left thyroid lobe. 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 There are 3 foci measuring 1.1 cm, 0.8 cm and 0.5 cm. Lympho-vascular invasion was not identified.  I called the patient. She is doing reasonably well. Voice is strong. Swallowing okay. No wound problems. I discussed the pathology report with her.  I told her that most likely the best approach would be completion thyroidectomy followed by postop radioiodine ablation. We talked about this for a while.  At this point in time she wants to discuss this with her husband tonight and call Dr. Juleen China  tomorrow.  She has an appointment to see me on November 18. I told her if she changed her mind and wanted to go ahead and schedule the completion thyroidectomy to call me and we will set that up.   [name]. Derrell Lolling, M.D., [facility] General and Minimally invasive Surgery Breast and Colorectal Surgery Office:   [phone] Pager:   [phone]

## 2012-08-20 ENCOUNTER — Ambulatory Visit (INDEPENDENT_AMBULATORY_CARE_PROVIDER_SITE_OTHER): Payer: Medicare Other | Admitting: General Surgery

## 2012-08-20 ENCOUNTER — Encounter (INDEPENDENT_AMBULATORY_CARE_PROVIDER_SITE_OTHER): Payer: Self-pay | Admitting: General Surgery

## 2012-08-20 VITALS — BP 136/72 | HR 74 | Temp 97.4°F | Resp 16 | Ht 61.0 in | Wt 131.1 lb

## 2012-08-20 DIAGNOSIS — C73 Malignant neoplasm of thyroid gland: Secondary | ICD-10-CM | POA: Insufficient documentation

## 2012-08-20 NOTE — Patient Instructions (Signed)
Your pathology report from your left thyroid lobectomy shows multifocal papillary thyroid cancer.  You will be scheduled for a completion right thyroid lobectomy and central compartment neck dissection.  Dr. Juleen China will supervise the radioiodine ablation postop.    Thyroidectomy Thyroidectomy is the removal of part or all of your thyroid gland. Your thyroid gland is a butterfly-shaped gland at the base of your neck. It produces a substance called thyroid hormone, which regulates the physical and chemical processes that keep your body functioning and make energy available to your body (metabolism). The amount of thyroid gland tissue that is removed during a thyroidectomy depends on the reason for the procedure. Typically, if only a part of your gland is removed, enough thyroid gland tissue remains to maintain normal function. If your entire thyroid gland is removed or if the amount of thyroid gland tissue remaining is inadequate to maintain normal function, you will need life-long treatment with thyroid hormone on a daily basis. Thyroidectomy maybe performed when you have the following conditions:  Thyroid nodules. These are small, abnormal collections of tissue that form inside the thyroid gland. If these nodules begin to enlarge at a rapid rate, a sample of tissue from the nodule is taken through a needle and examined (needle biopsy). This is done to determine if the nodules are cancerous. Depending on the outcome of this exam, thyroidectomy may be necessary.  Thyroid cancer.  Goiter, which is an enlarged thyroid gland. All or part of the thyroid gland may be removed if the gland has become so large that it causes difficulty breathing or swallowing.  Hyperthyroidism. This is when the thyroid gland produces too much thyroid hormone. Hypothyroidism can cause symptoms of fluctuating weight, intolerance to heat, irritability, shortness of breath, and chest pain. LET YOUR CAREGIVER KNOW ABOUT:    Allergies to food or medicine.  Medicines that you are taking, including vitamins, herbs, eyedrops, over-the-counter medicines, and creams.  Previous problems you have had with anesthetics or numbing medicines.  History of bleeding problems or blood clots.  Previous surgeries you have had.  Other health problems, including diabetes and kidney problems, you have had.  Possibility of pregnancy, if this applies. BEFORE THE PROCEDURE   Do not eat or drink anything, including water, for at least 6 hours before the procedure.  Ask your caregiver whether you should stop taking certain medicines before the day of the procedure. PROCEDURE  There are different ways that thyroidectomy is performed. For each type, you will be given a medicine to make you sleep (general anesthetic). The three main types of thyroidectomy are listed as follows:  Conventional thyroidectomy A cut (incision) in the center portion of your lower neck is made with a scalpel. Muscles below your skin are separated to gain access to your thyroid gland. Your thyroid gland is dissected from your windpipe (trachea). Often a drain is placed at the incision site to drain any blood that accumulates under the skin after the procedure. This drain will be removed before you go home. The wound from the incision should heal within 2 weeks.  Endoscopic thyroidectomy Small incisions are made in your lower neck. A small instrument (endoscope) is inserted under your skin at the incision sites. The endoscope used for thyroidectomy consists of 2 flexible tubes. Inside one of the tubes is a video camera that is used to guide the Careers adviser. Tools to remove the thyroid gland, including a tool to cut the gland (dissectors) and a suction device, are inserted through the  other tube. The surgeon uses the dissectors to dissect the thyroid gland from the trachea and remove it.  Robotic thyroidectomy This procedure allows your thyroid gland to be removed  through incisions in your armpit, your chest, or high in your neck. Instruments similar to endoscopes provide a 3-dimensional picture of the surgical site. Dissecting instruments are controlled by devices similar to joysticks. These devices allow more accurate manipulation of the instruments. After the blood supply to the gland is removed, the gland is cut into several pieces and removed through the incisions. RISKS AND COMPLICATIONS Complications associated with thyroidectomy are rare, but they can occur. Possible complications include:  A decrease in parathyroid hormone levels (hypoparathyroidism) Your parathyroid glands are located close behind your thyroid gland. They are responsible for maintaining calcium levels inthe body. If they are damaged or removed, levels of calcium in the blood become low and nerves become irritable, which can cause muscle spasms. Medicines are available to treat this.  Bacterial infection This can often be treated with medicines that kill bacteria (antibiotics).  Damage to your voice box nerves This could cause hoarseness or complete loss of voice.  Bleeding or airway obstruction. AFTER THE PROCEDURE   You will rest in the recovery room as you wake up.  When you first wake up, your throat may feel slightly sore.  You will not be allowed to eat or drink until instructed otherwise.  You will be taken to your hospital room. You will usually stay at the hospital for 1 or 2 nights.  If a drain is placed during the procedure, it usually is removed the next day.  You may have some mild neck pain.  Your voice may be weak. This usually is temporary. Document Released: 03/22/2001 Document Revised: 12/19/2011 Document Reviewed: 12/29/2010 [facility] Patient Information 2013 [place], [place].
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
## 2012-08-22 ENCOUNTER — Encounter (HOSPITAL_COMMUNITY): Payer: Self-pay | Admitting: Pharmacist

## 2012-08-24 ENCOUNTER — Encounter (HOSPITAL_COMMUNITY)
Admission: RE | Admit: 2012-08-24 | Discharge: 2012-08-24 | Disposition: A | Discharge status: Home / Self Care | Payer: Medicare Other | Source: Ambulatory Visit | Attending: General Surgery | Admitting: General Surgery

## 2012-08-24 ENCOUNTER — Encounter (HOSPITAL_COMMUNITY): Payer: Self-pay

## 2012-08-24 LAB — SURGICAL PCR SCREEN: MRSA, PCR: NEGATIVE

## 2012-08-24 LAB — CBC
Platelets: 386 10*3/uL (ref 150–400)
RBC: 4.72 MIL/uL (ref 3.87–5.11)
WBC: 6.5 10*3/uL (ref 4.0–10.5)

## 2012-08-24 NOTE — Pre-Procedure Instructions (Signed)
08-24-12 EKG/ CXR -Epic 10'13.

## 2012-08-24 NOTE — Patient Instructions (Addendum)
20 NAZLY DIGILIO  08/24/2012   Your procedure is scheduled on:  11-21 -2013  Report to [facility] at      1100  AM.  Call this number if you have problems the morning of surgery: [phone]  Or Presurgical Testing [phone](Lawan Nanez)   Remember:   Do not eat food:After Midnight.  May have clear liquids:up to 6 Hours before arrival. Nothing after : 0700 AM  Clear liquids include soda, tea, black coffee, apple or grape juice, broth.  Take these medicines the morning of surgery with A SIP OF WATER: Prevacid. Synthroid. Bentyl.   Do not wear jewelry, make-up or nail polish.  Do not wear lotions, powders, or perfumes. You may wear deodorant.  Do not shave 48 hours prior to surgery.(face and neck okay, no shaving of legs)  Do not bring valuables to the hospital.  Contacts, dentures or bridgework may not be worn into surgery.  Leave suitcase in the car. After surgery it may be brought to your room.  For patients admitted to the hospital, checkout time is 11:00 AM the day of discharge.   Patients discharged the day of surgery will not be allowed to drive home. Must have responsible person with you x 24 hours once discharged.  Name and phone number of your driver:   Special Instructions: CHG Shower Use Special Wash: see special instructions.(avoid face and genitals)   Please read over the following fact sheets that you were given: MRSA Information,  Incentive Spirometry Instruction.

## 2012-08-24 NOTE — Anesthesia Preprocedure Evaluation (Addendum)
Anesthesia Evaluation  Patient identified by MRN, date of birth, ID band Patient awake    Reviewed: Allergy & Precautions, H&P , NPO status , Patient's Chart, lab work & pertinent test results  History of Anesthesia Complications (+) DIFFICULT AIRWAY  Airway Mallampati: III TM Distance: >3 FB Neck ROM: Limited    Dental  (+) Dental Advisory Given and Teeth Intact   Pulmonary pneumonia -, resolved,  breath sounds clear to auscultation  Pulmonary exam normal       Cardiovascular Exercise Tolerance: Good - CAD Rhythm:Regular Rate:Normal     Neuro/Psych  Headaches, negative psych ROS   GI/Hepatic Neg liver ROS, GERD-  Medicated,  Endo/Other  Hypothyroidism   Renal/GU negative Renal ROS     Musculoskeletal negative musculoskeletal ROS (+)   Abdominal   Peds  Hematology negative hematology ROS (+)   Anesthesia Other Findings   Reproductive/Obstetrics                         Anesthesia Physical Anesthesia Plan  ASA: III  Anesthesia Plan: General   Post-op Pain Management:    Induction: Intravenous  Airway Management Planned: Oral ETT and Video Laryngoscope Planned  Additional Equipment:   Intra-op Plan:   Post-operative Plan: Extubation in OR  Informed Consent: I have reviewed the patients History and Physical, chart, labs and discussed the procedure including the risks, benefits and alternatives for the proposed anesthesia with the patient or authorized representative who has indicated his/her understanding and acceptance.   Dental advisory given  Plan Discussed with: CRNA  Anesthesia Plan Comments:        Anesthesia Quick Evaluation  

## 2012-08-27 ENCOUNTER — Encounter (INDEPENDENT_AMBULATORY_CARE_PROVIDER_SITE_OTHER): Payer: Medicare Other | Admitting: General Surgery

## 2012-08-28 ENCOUNTER — Telehealth (INDEPENDENT_AMBULATORY_CARE_PROVIDER_SITE_OTHER): Payer: Self-pay | Admitting: General Surgery

## 2012-08-28 NOTE — Telephone Encounter (Signed)
She called stating her BP was running high and she was concerned about that.  She does not have HTN.  She took a Valium and the BP has come down some.  I told her to recheck it in the morning and if her BP was still elevated, she needed to call her PCP.
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
## 2012-08-29 ENCOUNTER — Telehealth (INDEPENDENT_AMBULATORY_CARE_PROVIDER_SITE_OTHER): Payer: Self-pay | Admitting: General Surgery

## 2012-08-29 NOTE — Telephone Encounter (Signed)
PT called to report that her blood pressure is starting to become elevated again. She checked it at 4:15pm and it was 158/82. She also is feeling a little jittery. She talked with Dr. Abbey Chatters last night / see note. She took a valium and her BP was normal in am. Dr. Abbey Chatters told her to call PCP if it had not returned to [facility] has not contacted her PCP today. Her question is if she can take another Valium? gy

## 2012-08-29 NOTE — Telephone Encounter (Signed)
I paged Dr. Derrell Lolling re pt's request to take Valium and reviewed the situation. He said it would be ok for he to take a Valium this evening/pt notified/gy

## 2012-08-30 ENCOUNTER — Encounter (HOSPITAL_COMMUNITY): Payer: Self-pay | Admitting: Anesthesiology

## 2012-08-30 ENCOUNTER — Ambulatory Visit (HOSPITAL_COMMUNITY): Payer: Medicare Other | Admitting: Anesthesiology

## 2012-08-30 ENCOUNTER — Encounter (HOSPITAL_COMMUNITY)
Admission: RE | Disposition: A | Discharge status: Home / Self Care | Payer: Self-pay | Source: Ambulatory Visit | Attending: General Surgery

## 2012-08-30 ENCOUNTER — Ambulatory Visit (HOSPITAL_COMMUNITY)
Admission: RE | Admit: 2012-08-30 | Discharge: 2012-08-31 | Disposition: A | Discharge status: Home / Self Care | Payer: Medicare Other | Source: Ambulatory Visit | Attending: General Surgery | Admitting: General Surgery

## 2012-08-30 ENCOUNTER — Encounter (HOSPITAL_COMMUNITY): Payer: Self-pay

## 2012-08-30 DIAGNOSIS — Z7982 Long term (current) use of aspirin: Secondary | ICD-10-CM | POA: Insufficient documentation

## 2012-08-30 DIAGNOSIS — E039 Hypothyroidism, unspecified: Secondary | ICD-10-CM | POA: Insufficient documentation

## 2012-08-30 DIAGNOSIS — C73 Malignant neoplasm of thyroid gland: Secondary | ICD-10-CM

## 2012-08-30 DIAGNOSIS — Z79899 Other long term (current) drug therapy: Secondary | ICD-10-CM | POA: Insufficient documentation

## 2012-08-30 DIAGNOSIS — Z01812 Encounter for preprocedural laboratory examination: Secondary | ICD-10-CM | POA: Insufficient documentation

## 2012-08-30 DIAGNOSIS — E785 Hyperlipidemia, unspecified: Secondary | ICD-10-CM | POA: Insufficient documentation

## 2012-08-30 DIAGNOSIS — K219 Gastro-esophageal reflux disease without esophagitis: Secondary | ICD-10-CM | POA: Insufficient documentation

## 2012-08-30 HISTORY — DX: Malignant (primary) neoplasm, unspecified: C80.1

## 2012-08-30 HISTORY — PX: THYROID LOBECTOMY: SHX420

## 2012-08-30 SURGERY — LOBECTOMY, THYROID
Anesthesia: General | Laterality: Right | Wound class: Clean

## 2012-08-30 MED ORDER — MEPERIDINE HCL 50 MG/ML IJ SOLN: 6.2500 mg | INTRAMUSCULAR | Status: DC | PRN

## 2012-08-30 MED ORDER — VANCOMYCIN HCL IN DEXTROSE 1-5 GM/200ML-% IV SOLN
INTRAVENOUS | Status: AC
  Filled 2012-08-30: qty 200

## 2012-08-30 MED ORDER — VANCOMYCIN HCL IN DEXTROSE 1-5 GM/200ML-% IV SOLN
1000.0000 mg | INTRAVENOUS | Status: AC
  Administered 2012-08-30: 1000 mg via INTRAVENOUS

## 2012-08-30 MED ORDER — EPHEDRINE SULFATE 50 MG/ML IJ SOLN
INTRAMUSCULAR | Status: DC | PRN
  Administered 2012-08-30: 10 mg via INTRAVENOUS

## 2012-08-30 MED ORDER — CALCIUM CARBONATE-VITAMIN D 500-200 MG-UNIT PO TABS
2.0000 | ORAL_TABLET | Freq: Two times a day (BID) | ORAL | Status: DC
  Administered 2012-08-30 – 2012-08-31 (×2): 2 via ORAL
  Filled 2012-08-30 (×3): qty 2

## 2012-08-30 MED ORDER — OXYCODONE HCL 5 MG PO TABS: 5.0000 mg | ORAL_TABLET | Freq: Once | ORAL | Status: DC | PRN

## 2012-08-30 MED ORDER — PROMETHAZINE HCL 25 MG/ML IJ SOLN: 6.2500 mg | INTRAMUSCULAR | Status: DC | PRN

## 2012-08-30 MED ORDER — ARTIFICIAL TEARS OP OINT
1.0000 "application " | TOPICAL_OINTMENT | Freq: Every day | OPHTHALMIC | Status: DC
  Administered 2012-08-30: 1 via OPHTHALMIC
  Filled 2012-08-30: qty 3.5

## 2012-08-30 MED ORDER — ACETAMINOPHEN 10 MG/ML IV SOLN: 1000.0000 mg | Freq: Once | INTRAVENOUS | Status: DC | PRN

## 2012-08-30 MED ORDER — PROPOFOL 10 MG/ML IV EMUL
INTRAVENOUS | Status: DC | PRN
  Administered 2012-08-30: 200 mg via INTRAVENOUS

## 2012-08-30 MED ORDER — POTASSIUM CHLORIDE IN NACL 20-0.9 MEQ/L-% IV SOLN
INTRAVENOUS | Status: DC
  Administered 2012-08-30 – 2012-08-31 (×2): via INTRAVENOUS
  Filled 2012-08-30 (×3): qty 1000

## 2012-08-30 MED ORDER — CHLORHEXIDINE GLUCONATE 4 % EX LIQD: 1.0000 "application " | Freq: Once | CUTANEOUS | Status: DC

## 2012-08-30 MED ORDER — ONDANSETRON HCL 4 MG/2ML IJ SOLN
INTRAMUSCULAR | Status: DC | PRN
  Administered 2012-08-30: 4 mg via INTRAVENOUS

## 2012-08-30 MED ORDER — MORPHINE SULFATE 2 MG/ML IJ SOLN
INTRAMUSCULAR | Status: AC
  Filled 2012-08-30: qty 1

## 2012-08-30 MED ORDER — BUPIVACAINE-EPINEPHRINE 0.5% -1:200000 IJ SOLN
INTRAMUSCULAR | Status: DC | PRN
  Administered 2012-08-30: 7 mL

## 2012-08-30 MED ORDER — ONDANSETRON HCL 4 MG PO TABS: 4.0000 mg | ORAL_TABLET | Freq: Four times a day (QID) | ORAL | Status: DC | PRN

## 2012-08-30 MED ORDER — CISATRACURIUM BESYLATE (PF) 10 MG/5ML IV SOLN
INTRAVENOUS | Status: DC | PRN
  Administered 2012-08-30: 2 mg via INTRAVENOUS
  Administered 2012-08-30: 6 mg via INTRAVENOUS

## 2012-08-30 MED ORDER — OXYCODONE HCL 5 MG/5ML PO SOLN
5.0000 mg | Freq: Once | ORAL | Status: DC | PRN
  Filled 2012-08-30: qty 5

## 2012-08-30 MED ORDER — LIDOCAINE HCL (CARDIAC) 20 MG/ML IV SOLN
INTRAVENOUS | Status: DC | PRN
  Administered 2012-08-30: 100 mg via INTRAVENOUS

## 2012-08-30 MED ORDER — HYDROMORPHONE HCL PF 1 MG/ML IJ SOLN
INTRAMUSCULAR | Status: AC
  Filled 2012-08-30: qty 1

## 2012-08-30 MED ORDER — OXYCODONE-ACETAMINOPHEN 5-325 MG PO TABS: 1.0000 | ORAL_TABLET | ORAL | Status: DC | PRN

## 2012-08-30 MED ORDER — CHLORHEXIDINE GLUCONATE 4 % EX LIQD
1.0000 "application " | Freq: Once | CUTANEOUS | Status: DC
  Filled 2012-08-30: qty 15

## 2012-08-30 MED ORDER — GLYCOPYRROLATE 0.2 MG/ML IJ SOLN
INTRAMUSCULAR | Status: DC | PRN
  Administered 2012-08-30: .4 mg via INTRAVENOUS

## 2012-08-30 MED ORDER — MORPHINE SULFATE 2 MG/ML IJ SOLN
2.0000 mg | INTRAMUSCULAR | Status: DC | PRN
  Administered 2012-08-30 – 2012-08-31 (×5): 2 mg via INTRAVENOUS
  Filled 2012-08-30 (×4): qty 1

## 2012-08-30 MED ORDER — CALCITONIN (SALMON) 200 UNIT/ACT NA SOLN
1.0000 | Freq: Every day | NASAL | Status: DC
  Filled 2012-08-30: qty 3.7

## 2012-08-30 MED ORDER — ACETAMINOPHEN 10 MG/ML IV SOLN
INTRAVENOUS | Status: AC
  Filled 2012-08-30: qty 100

## 2012-08-30 MED ORDER — CALCIUM CARBONATE ANTACID 500 MG PO CHEW
2.0000 | CHEWABLE_TABLET | Freq: Three times a day (TID) | ORAL | Status: DC | PRN
  Filled 2012-08-30: qty 2

## 2012-08-30 MED ORDER — NEOSTIGMINE METHYLSULFATE 1 MG/ML IJ SOLN
INTRAMUSCULAR | Status: DC | PRN
  Administered 2012-08-30: 2 mg via INTRAVENOUS

## 2012-08-30 MED ORDER — HYDROMORPHONE HCL PF 1 MG/ML IJ SOLN
0.2500 mg | INTRAMUSCULAR | Status: DC | PRN
  Administered 2012-08-30: 0.25 mg via INTRAVENOUS
  Administered 2012-08-30: 0.5 mg via INTRAVENOUS
  Administered 2012-08-30: 0.25 mg via INTRAVENOUS

## 2012-08-30 MED ORDER — ACETAMINOPHEN 10 MG/ML IV SOLN
INTRAVENOUS | Status: DC | PRN
  Administered 2012-08-30: 1000 mg via INTRAVENOUS

## 2012-08-30 MED ORDER — SUCCINYLCHOLINE CHLORIDE 20 MG/ML IJ SOLN
INTRAMUSCULAR | Status: DC | PRN
  Administered 2012-08-30: 100 mg via INTRAVENOUS

## 2012-08-30 MED ORDER — DEXAMETHASONE SODIUM PHOSPHATE 10 MG/ML IJ SOLN
INTRAMUSCULAR | Status: DC | PRN
  Administered 2012-08-30: 10 mg via INTRAVENOUS

## 2012-08-30 MED ORDER — CYCLOSPORINE 0.05 % OP EMUL
1.0000 [drp] | Freq: Two times a day (BID) | OPHTHALMIC | Status: DC
  Administered 2012-08-30: 1 [drp] via OPHTHALMIC
  Filled 2012-08-30 (×3): qty 1

## 2012-08-30 MED ORDER — HEPARIN SODIUM (PORCINE) 5000 UNIT/ML IJ SOLN
5000.0000 [IU] | Freq: Three times a day (TID) | INTRAMUSCULAR | Status: DC
  Administered 2012-08-31: 5000 [IU] via SUBCUTANEOUS
  Filled 2012-08-30 (×3): qty 1

## 2012-08-30 MED ORDER — FENTANYL CITRATE 0.05 MG/ML IJ SOLN
INTRAMUSCULAR | Status: DC | PRN
  Administered 2012-08-30 (×5): 50 ug via INTRAVENOUS

## 2012-08-30 MED ORDER — DICYCLOMINE HCL 10 MG PO CAPS
10.0000 mg | ORAL_CAPSULE | Freq: Every morning | ORAL | Status: DC
Start: 2012-08-31 — End: 2012-08-31
  Administered 2012-08-31: 10 mg via ORAL
  Filled 2012-08-30: qty 1

## 2012-08-30 MED ORDER — LACTATED RINGERS IV SOLN
INTRAVENOUS | Status: DC | PRN
  Administered 2012-08-30 (×2): via INTRAVENOUS

## 2012-08-30 MED ORDER — POTASSIUM CHLORIDE IN NACL 20-0.9 MEQ/L-% IV SOLN
INTRAVENOUS | Status: AC
  Administered 2012-08-30: 1000 mL
  Filled 2012-08-30: qty 1000

## 2012-08-30 MED ORDER — ONDANSETRON HCL 4 MG/2ML IJ SOLN: 4.0000 mg | Freq: Four times a day (QID) | INTRAMUSCULAR | Status: DC | PRN

## 2012-08-30 MED ORDER — BUPIVACAINE-EPINEPHRINE (PF) 0.5% -1:200000 IJ SOLN
INTRAMUSCULAR | Status: AC
  Filled 2012-08-30: qty 10

## 2012-08-30 MED ORDER — MIDAZOLAM HCL 5 MG/5ML IJ SOLN
INTRAMUSCULAR | Status: DC | PRN
  Administered 2012-08-30 (×2): 1 mg via INTRAVENOUS

## 2012-08-30 MED ORDER — PANTOPRAZOLE SODIUM 20 MG PO TBEC
20.0000 mg | DELAYED_RELEASE_TABLET | Freq: Every day | ORAL | Status: DC
  Administered 2012-08-31: 20 mg via ORAL
  Filled 2012-08-30: qty 1

## 2012-08-30 SURGICAL SUPPLY — 43 items
ADH SKN CLS APL DERMABOND .7 (GAUZE/BANDAGES/DRESSINGS) ×4
APL SKNCLS STERI-STRIP NONHPOA (GAUZE/BANDAGES/DRESSINGS)
ATTRACTOMAT 16X20 MAGNETIC DRP (DRAPES) ×3 IMPLANT
BENZOIN TINCTURE PRP APPL 2/3 (GAUZE/BANDAGES/DRESSINGS) ×1 IMPLANT
BLADE HEX COATED 2.75 (ELECTRODE) ×1 IMPLANT
BLADE SURG 15 STRL LF DISP TIS (BLADE) ×2 IMPLANT
BLADE SURG 15 STRL SS (BLADE) ×3
CANISTER SUCTION 2500CC (MISCELLANEOUS) ×3 IMPLANT
CHLORAPREP W/TINT 10.5 ML (MISCELLANEOUS) ×3 IMPLANT
CLIP TI MEDIUM 6 (CLIP) ×6 IMPLANT
CLIP TI WIDE RED SMALL 6 (CLIP) ×6 IMPLANT
CLOTH BEACON ORANGE TIMEOUT ST (SAFETY) ×3 IMPLANT
DERMABOND ADVANCED (GAUZE/BANDAGES/DRESSINGS) ×2
DERMABOND ADVANCED .7 DNX12 (GAUZE/BANDAGES/DRESSINGS) ×2 IMPLANT
DISSECTOR ROUND CHERRY 3/8 STR (MISCELLANEOUS) ×2 IMPLANT
DRAPE PED LAPAROTOMY (DRAPES) ×3 IMPLANT
DRESSING SURGICEL FIBRLLR 1X2 (HEMOSTASIS) ×2 IMPLANT
DRSG SURGICEL FIBRILLAR 1X2 (HEMOSTASIS) ×3
ELECT REM PT RETURN 9FT ADLT (ELECTROSURGICAL) ×3
ELECTRODE REM PT RTRN 9FT ADLT (ELECTROSURGICAL) ×2 IMPLANT
GAUZE SPONGE 4X4 16PLY XRAY LF (GAUZE/BANDAGES/DRESSINGS) ×5 IMPLANT
GLOVE EUDERMIC 7 POWDERFREE (GLOVE) ×3 IMPLANT
GOWN STRL NON-REIN LRG LVL3 (GOWN DISPOSABLE) ×3 IMPLANT
GOWN STRL REIN XL XLG (GOWN DISPOSABLE) ×6 IMPLANT
KIT BASIN OR (CUSTOM PROCEDURE TRAY) ×3 IMPLANT
NS IRRIG 1000ML POUR BTL (IV SOLUTION) ×1 IMPLANT
PACK BASIC VI WITH GOWN DISP (CUSTOM PROCEDURE TRAY) ×3 IMPLANT
PENCIL BUTTON HOLSTER BLD 10FT (ELECTRODE) ×3 IMPLANT
SHEARS HARMONIC 9CM CVD (BLADE) ×3 IMPLANT
SPONGE GAUZE 4X4 12PLY (GAUZE/BANDAGES/DRESSINGS) IMPLANT
STAPLER VISISTAT 35W (STAPLE) ×1 IMPLANT
STRIP CLOSURE SKIN 1/2X4 (GAUZE/BANDAGES/DRESSINGS) ×1 IMPLANT
SUT MNCRL AB 4-0 PS2 18 (SUTURE) ×3 IMPLANT
SUT SILK 2 0 (SUTURE) ×3
SUT SILK 2-0 18XBRD TIE 12 (SUTURE) ×2 IMPLANT
SUT SILK 3 0 (SUTURE)
SUT SILK 3-0 18XBRD TIE 12 (SUTURE) IMPLANT
SUT VIC AB 3-0 SH 18 (SUTURE) ×5 IMPLANT
SUT VICRYL 2 0 18  UND BR (SUTURE) ×1
SUT VICRYL 2 0 18 UND BR (SUTURE) ×1 IMPLANT
SYR BULB IRRIGATION 50ML (SYRINGE) ×3 IMPLANT
TOWEL OR 17X26 10 PK STRL BLUE (TOWEL DISPOSABLE) ×3 IMPLANT
YANKAUER SUCT BULB TIP 10FT TU (MISCELLANEOUS) ×3 IMPLANT

## 2012-08-30 NOTE — Anesthesia Postprocedure Evaluation (Signed)
  Anesthesia Post-op Note  Patient: Sherry Olsen  Procedure(s) Performed: Procedure(s) (LRB): THYROID LOBECTOMY (Right)  Patient Location: PACU  Anesthesia Type: General  Level of Consciousness: awake and alert   Airway and Oxygen Therapy: Patient Spontanous Breathing  Post-op Pain: mild  Post-op Assessment: Post-op Vital signs reviewed, Patient's Cardiovascular Status Stable, Respiratory Function Stable, Patent Airway and No signs of Nausea or vomiting  Last Vitals:  Filed Vitals:   08/30/12 1819  BP: 173/79  Pulse: 105  Temp: 36.7 C  Resp: 13    Post-op Vital Signs: stable   Complications: No apparent anesthesia complications

## 2012-08-30 NOTE — Preoperative (Signed)
Beta Blockers   Reason not to administer Beta Blockers:Not Applicable 

## 2012-08-30 NOTE — Op Note (Signed)
Patient Name:           Sherry Olsen   Date of Surgery:        08/30/2012  Pre op Diagnosis:       multifocal papillary thyroid carcinoma, status post left thyroid lobectomy  Post op Diagnosis:    Same  Procedure:                 Completion right thyroid lobectomy, central compartment node sampling  Surgeon:                     [name]. Derrell Lolling, M.D., FACS  Assistant:                      Manus Rudd, M.D., [facility]  Operative Indications:   This is a 78 year old female who has been followed with a multinodular goiter for many years and has been on Synthroid from a Denis years. Recent left lower lobectomy was performed because of an enlarging, but biopsy negative mass. The final pathology showed multifocal thyroid carcinoma with Hrthle cell changes, follicular.. The largest nodule containing cancer was 1.1 cm. The large nodule was not cancerous. She did well after that surgery. She was advised to have completion thyroid lobectomy and central compartment node dissection and radioiodine ablation. She has discussed this with me and with Dr. Juleen China and is here to have the surgery electively.  Operative Findings:       The right thyroid lobe was small and a little bit bumpy. There were no large masses. The tissues were somewhat scarred and fibrotic because of the recent surgery. There was no evidence of invasive cancer grossly.  Procedure in Detail:          Following the induction of general endotracheal anesthesia the patient was placed in reverse Trendelenburg position. A small roll was placed behind her shoulders to slightly extend her neck. The neck was prepped and draped in a sterile fashion. Intravenous antibiotics were given. Surgical time out was performed. 0.5% Marcaine with epinephrine was used as local infiltration anesthetic. Using a knife I reopened the old incision. Dissection was carried down through the subcutaneous tissue and the platysma layer. I initially gained access to the  tissue plane between the strap muscles and the platysma muscle carefully dissected the skin and platysma flaps superiorly inferiorly and placed a self-retaining retractor. I saw  some of the Vicryl sutures and divided those and divided the strap muscles in the midline. There were a couple of veins that had to be isolated, clamped, divided and ligated with 3-0 Vicryl ties. We were ultimately able to dissect the right strap muscles off of the right thyroid lobe I carried the dissection to the inferior pole first to mobilize it up and divided some of the veins  with the harmonic scalpel. I carried this dissection up to the superior pole. We saw what we thought was a superior parathyroid gland and preserved that. We took down the superior pole vessels with the harmonic scalpel and metal clips. We slowly dissected the remainder of the gland from lateral to medial. We felt we identified the course of the recurrent laryngeal nerve and we felt that we did not injure that. I very slowly divided the ligament of Berry with a knife  it was so thick. Middle thyroid vein and inferior thyroid artery were controlled with hemoclips and harmonic scalpel. We fell that we saw the area where the inferior parathyroid gland was  but  we did not resect that there extensively because the tissues were somewhat thickened and we felt that the recurrent original nerve was going just behind that. We then further mobilized the thyroid lobe up to the midline where the isthmus  had been divided at the previous operation. We did not see a pyramidal lobe. There were a couple of nodules in the pretracheal space which were removed and sent as questionable pretracheal lymph nodes. At the completion of the case the trachea was actually skeletonized all the way down to below the suprasternal notch. We did not feel any further dissection would be indicated. The wounds were irrigated with saline. We placed some hemostatic sponge in the thyroid bed and after  5 or 10 minutes there was no bleeding whatsoever.  We had good hemostasis. Strap muscles were closed with interrupted 3-0 Vicryl sutures. Platysma muscle was closed with interrupted 3-0 Vicryl sutures. Skin was closed with running subcuticular suture of 4-0 Monocryl and Dermabond. The patient was taken to recovery room in  stable condition. EBL 20 cc. Counts correct. Complications none.     [name]. Derrell Lolling, M.D., FACS General and Minimally Invasive Surgery Breast and Colorectal Surgery  08/30/2012 4:24 PM

## 2012-08-30 NOTE — Transfer of Care (Signed)
Immediate Anesthesia Transfer of Care Note  Patient: Sherry Olsen  Procedure(s) Performed: Procedure(s) (LRB) with comments: THYROID LOBECTOMY (Right) - Completion Thyriod Lobectomy Right, Central Compartment Node sampling  Patient Location: PACU  Anesthesia Type:General  Level of Consciousness: awake, oriented, patient cooperative, lethargic and responds to stimulation  Airway & Oxygen Therapy: Patient Spontanous Breathing and Patient connected to face mask oxygen  Post-op Assessment: Report given to PACU RN, Post -op Vital signs reviewed and stable and Patient moving all extremities  Post vital signs: Reviewed and stable  Complications: No apparent anesthesia complications

## 2012-08-30 NOTE — Interval H&P Note (Signed)
History and Physical Interval Note:  08/30/2012 1:46 PM  Sherry Olsen  has presented today for surgery, with the diagnosis of thyroid cancer  The goals and the various methods of treatment have been discussed with the patient and family. After consideration of risks, benefits and other options for treatment, the patient has consented to  Procedure(s) (LRB) with comments: THYROID LOBECTOMY (Right) - Completion Thyriod Lobectomy Right, Central Compartment Node Dissection NODE DISSECTION (N/A) as a surgical intervention .  The patient's history has been reviewed, patient examined, no change in status, stable for surgery.  I have reviewed the patient's chart and labs.  Questions were answered to the patient's satisfaction.     [name]

## 2012-08-31 ENCOUNTER — Encounter (HOSPITAL_COMMUNITY): Payer: Self-pay | Admitting: General Surgery

## 2012-08-31 LAB — BASIC METABOLIC PANEL
BUN: 6 mg/dL (ref 6–23)
CO2: 25 mEq/L (ref 19–32)
Glucose, Bld: 137 mg/dL — ABNORMAL HIGH (ref 70–99)
Potassium: 4.1 mEq/L (ref 3.5–5.1)
Sodium: 134 mEq/L — ABNORMAL LOW (ref 135–145)

## 2012-08-31 MED ORDER — HYDROCODONE-ACETAMINOPHEN 5-325 MG PO TABS: 1.0000 | ORAL_TABLET | ORAL | Status: DC | PRN

## 2012-08-31 MED ORDER — HYDROCODONE-ACETAMINOPHEN 5-325 MG PO TABS
1.0000 | ORAL_TABLET | Freq: Four times a day (QID) | ORAL | Status: DC | PRN
  Administered 2012-08-31: 2 via ORAL
  Filled 2012-08-31: qty 2

## 2012-08-31 NOTE — Progress Notes (Signed)
1 Day Post-Op  Subjective: Feels well. Minimal pain. Swallowing without difficulty. Voice is strong. No paresthesias. Calcium level 9.4 this am.  Objective: Vital signs in last 24 hours: Temp:  [97.4 F (36.3 C)-98.5 F (36.9 C)] 97.5 F (36.4 C) (11/22 0127) Pulse Rate:  [82-108] 101  (11/22 0127) Resp:  [13-21] 18  (11/22 0127) BP: (134-176)/(70-85) 134/82 mmHg (11/22 0127) SpO2:  [97 %-100 %] 98 % (11/22 0127) Weight:  [131 lb 5 oz (59.563 kg)] 131 lb 5 oz (59.563 kg) (11/21 2203)    Intake/Output from previous day: 11/21 0701 - 11/22 0700 In: 2891.7 [P.O.:240; I.V.:2651.7] Out: 900 [Blood:900] Intake/Output this shift: Total I/O In: 741.7 [P.O.:240; I.V.:501.7] Out: -   General appearance: alert. In no distress. Pleasant. Voice is strong. Neck: , trachea midline,   neck incision looks good. No hematoma.Chvostek's sign negative bilaterally.  Lab Results:  Results for orders placed during the hospital encounter of 08/30/12 (from the past 24 hour(s))  BASIC METABOLIC PANEL     Status: Abnormal   Collection Time   08/31/12  4:20 AM      Component Value Range   Sodium 134 (*) 135 - 145 mEq/L   Potassium 4.1  3.5 - 5.1 mEq/L   Chloride 98  96 - 112 mEq/L   CO2 25  19 - 32 mEq/L   Glucose, Bld 137 (*) 70 - 99 mg/dL   BUN 6  6 - 23 mg/dL   Creatinine, Ser 1.61 (*) 0.50 - 1.10 mg/dL   Calcium 9.4  8.4 - 09.6 mg/dL   GFR calc non Af Amer >90  >90 mL/min   GFR calc Af Amer >90  >90 mL/min     Studies/Results: @RISRSLT24 @     . [COMPLETED] 0.9 % NaCl with KCl 20 mEq / L      . artificial tears  1 application Both Eyes QHS  . calcitonin (salmon)  1 spray Alternating Nares Daily  . calcium-vitamin D  2 tablet Oral BID  . cycloSPORINE  1 drop Both Eyes BID  . dicyclomine  10 mg Oral q morning - 10a  . heparin  5,000 Units Subcutaneous Q8H  . [EXPIRED] HYDROmorphone      . morphine      . pantoprazole  20 mg Oral Daily  . [COMPLETED] vancomycin  1,000 mg  Intravenous On Call to OR  . [DISCONTINUED] chlorhexidine  1 application Topical Once  . [DISCONTINUED] chlorhexidine  1 application Topical Once     Assessment/Plan: s/p Procedure(s): THYROID LOBECTOMY  POD #1. Stable and doing well. Ready for discharge She has prescription for Vicodin at home that, So no new prescription written. Discontinue Synthroid in anticipation of RAI treatment in a few weeks She was advised to make appt. With Dr. Juleen China Return to see me in 3 weeks Continue daily calcium Diet activities discussed.    LOS: 1 day    Hisham Provence M. Derrell Lolling, M.D., [facility] General and Minimally invasive Surgery Breast and Colorectal Surgery Office:   [phone] Pager:   [phone]  08/31/2012  . .prob

## 2012-08-31 NOTE — Progress Notes (Signed)
Patient discharged to home.  Discharge instructions reviewed with patient by previous RN Clarissa.  Patient does not have any questions at this time.  IV removed from left hand.  Patient has all belongings.  Patient escorted to lobby via wheelchair by nurse tech.  Patient discharged.

## 2012-08-31 NOTE — Discharge Summary (Signed)
Patient ID: Sherry Olsen [id number] 78 y.o. Sep 05, 1933  08/30/2012  Discharge date and time: August 31, 2012  Admitting Physician: [name]  Discharge Physician: [name]  Admission Diagnoses: thyriod cancer  Discharge Diagnoses: Multifocal papillary thyroid carcinoma  Operations: Procedure(s): THYROID LOBECTOMY  Admission Condition: good  Discharged Condition: good  Indication for Admission: This patient recently presented with multinodular goiter, having been on Synthroid for years. She had multiple bilateral nodules. She had an enlarging, dominant nodule on the left thyroid. Needle biopsy did not show cancer. Recent left thyroid lobectomy, final pathology showed multifocal papillary thyroid carcinoma, follicular.. The largest cancer nodule was  1.1 cm, but the large nodule was benign. She did well postop. She was advised to undergo completion thyroidectomy and then subsequent radioiodine ablation. She has discussed this with me and with Dr. Adela Lank. She is brought to the operating room electively for completion thyroidectomy.  Hospital Course: On the day of admission the patient was brought to the operating room and underwent exploration of the neck, right thyroid lobectomy, and limited pretracheal lymph node dissection. The surgery was uneventful. On postop day one she was doing well. Voice was strong. The wound looked good. She felt ready to go home. Calcium level was 9.4. I discussed followup with me and also advised her to make an appointment with Dr. Juleen China who will supervise her radioiodine ablation. She was advised to discontinue her Synthroid in anticipation of radioiodine ablation she has a prescription for Vicodin at home and so she does not need any further prescription for that. She has an appointment to see me in about 3 weeks.  Consults: None  Significant Diagnostic Studies: none  Treatments: surgery: Right thyroid lobectomy, limited  pretracheal lymph node dissection  Disposition: Home  Patient Instructions:   Nica, Friske  Home Medication Instructions WUJ:[id number]   Printed on:08/31/12 9562  Medication Information                    aspirin 81 MG chewable tablet Chew 81 mg by mouth every other day.            fish oil-omega-3 fatty acids 1000 MG capsule Take 1 g by mouth 2 (two) times daily.            cycloSPORINE (RESTASIS) 0.05 % ophthalmic emulsion Place 1 drop into both eyes 2 (two) times daily.           Carboxymethylcell-Hypromellose (GENTEAL) 0.25-0.3 % GEL Apply 1 drop to eye at bedtime.           atorvastatin (LIPITOR) 10 MG tablet Take 5 mg by mouth every evening.            OVER THE COUNTER MEDICATION Take 2 tablets by mouth 2 (two) times daily. Ocular Nutrition with Lutein eye vitamin           acetaminophen (TYLENOL) 500 MG tablet Take 500 mg by mouth every 6 (six) hours as needed. For pain           dicyclomine (BENTYL) 10 MG capsule Take 10 mg by mouth every morning.           lansoprazole (PREVACID) 30 MG capsule Take 30 mg by mouth every morning.            calcium carbonate (TUMS EX) 750 MG chewable tablet Chew 1 tablet by mouth 3 (three) times daily as needed. For indigestion           calcitonin, salmon, (  MIACALCIN/FORTICAL) 200 UNIT/ACT nasal spray Place 1 spray into the nose daily.           calcium-vitamin D (CALCIUM 500+D HIGH POTENCY) 500-400 MG-UNIT per tablet Take 2 tablets by mouth 2 (two) times daily.           Simethicone (GAS-X EXTRA STRENGTH) 125 MG CAPS Take 1 capsule by mouth 3 (three) times daily as needed. For indigestion           polyethylene glycol (MIRALAX / GLYCOLAX) packet Take 8.5 g by mouth daily.           cetirizine (ZYRTEC) 10 MG tablet Take 10 mg by mouth daily as needed. Seasonal allergic.           HYDROcodone-acetaminophen (NORCO/VICODIN) 5-325 MG per tablet Take 1-2 tablets by mouth every 4 (four) hours as needed for pain.            clarithromycin (BIAXIN XL) 500 MG 24 hr tablet Take 1,000 mg by mouth daily.            Polyethyl Glycol-Propyl Glycol (SYSTANE) 0.4-0.3 % SOLN Apply 1 drop to eye 3 (three) times daily as needed. Dry eyes.           diazepam (VALIUM) 5 MG tablet Take 5 mg by mouth once. As needed             Activity: activity as tolerated Diet: low fat, low cholesterol diet Wound Care: none needed  Follow-up:  With Dr. Derrell Lolling in 3 weeks.  Signed: [name]. Derrell Lolling, M.D., FACS General and minimally invasive surgery Breast and Colorectal Surgery  08/31/2012, 6:19 AM

## 2012-09-05 NOTE — Progress Notes (Signed)
Quick Note:  Inform patient of Pathology report,.Tell her no more cancer, but several precancerous growths. Will discuss more at next OV.  hmi ______

## 2012-09-10 ENCOUNTER — Telehealth (INDEPENDENT_AMBULATORY_CARE_PROVIDER_SITE_OTHER): Payer: Self-pay | Admitting: General Surgery

## 2012-09-10 NOTE — Telephone Encounter (Signed)
Called patient and advised of pathology report per Dr. Jacinto Halim request "tell her no more cancer, but several precancerous growths. Will discuss more at next OV". Confirmed with patient that she will be seeing Dr. Derrell Lolling on 09/24/12 at 5:15.

## 2012-09-11 ENCOUNTER — Encounter (INDEPENDENT_AMBULATORY_CARE_PROVIDER_SITE_OTHER): Payer: Self-pay | Admitting: General Surgery

## 2012-09-12 NOTE — Progress Notes (Signed)
Received request from [facility], Dr. Juleen China (fax # [phone]) requesting a copy of the pathology report dating 08/31/12. Confirmation received. Notation made on cover sheet that the pathology results will be discussed with the patient during post op visit on 09/24/12.

## 2012-09-24 ENCOUNTER — Encounter (INDEPENDENT_AMBULATORY_CARE_PROVIDER_SITE_OTHER): Payer: Self-pay | Admitting: General Surgery

## 2012-09-24 ENCOUNTER — Ambulatory Visit (INDEPENDENT_AMBULATORY_CARE_PROVIDER_SITE_OTHER): Payer: Medicare Other | Admitting: General Surgery

## 2012-09-24 VITALS — BP 132/80 | HR 88 | Temp 98.9°F | Resp 16 | Ht 61.5 in | Wt 133.0 lb

## 2012-09-24 DIAGNOSIS — C73 Malignant neoplasm of thyroid gland: Secondary | ICD-10-CM

## 2012-09-24 NOTE — Progress Notes (Signed)
Patient ID: Sherry Olsen, female   DOB: [birth year], 78 y.o.   MRN: [id number] History: This patient underwent left tyroid lobectomy because of multinodular goiter. 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 She did well after that surgery. She was returned to the operating room on November 21 and underwent completion right thyroid lobectomy and central compartment node sampling. She had no problems after that surgery. Final pathology showed a microfollicular adenoma, multiple, thyroiditis, one parathyroid tissue but no malignant features. She has recovered from the surgery. Her voice is normal. She has no paresthesias. She has no swallowing problems. She is being followed closely by Dr. Juleen Olsen who is going to coordinate her radioiodine ablation and thyroid hormone replacement.  Exam: Patient looks well. Voice is excellent. Neck incision looks good. Chvostek's sign is negative bilaterally.  Assessment: Multifocal papillary thyroid carcinoma, now status post total thyroidectomy and central compartment node sampling. Recovering uneventfully  Plan: Dr. Adela Olsen,  will coordinate her radioiodine ablation and thyroid hormone replacement Return to see me in 4 months   .Sherry Olsen. Sherry Olsen, M.D., [facility] General and Minimally invasive Surgery Breast and Colorectal Surgery Office:   [phone] Pager:   [phone]

## 2012-09-24 NOTE — Patient Instructions (Signed)
You are recovering from your thyroid surgeries without any obvious surgical complication. You look very good today.  Dr. Juleen China will coordinate the radioiodine treatment for your thyroid cancer, and he will coordinate your thyroid hormone dose after the treatment is done.  Return to see Dr. Derrell Lolling in 4 months.

## 2012-10-18 ENCOUNTER — Other Ambulatory Visit (HOSPITAL_COMMUNITY): Payer: Self-pay | Admitting: Endocrinology

## 2012-10-18 DIAGNOSIS — C73 Malignant neoplasm of thyroid gland: Secondary | ICD-10-CM

## 2012-10-19 ENCOUNTER — Ambulatory Visit (HOSPITAL_COMMUNITY)
Admission: RE | Admit: 2012-10-19 | Discharge: 2012-10-19 | Disposition: A | Discharge status: Home / Self Care | Payer: Medicare Other | Source: Ambulatory Visit | Attending: Endocrinology | Admitting: Endocrinology

## 2012-10-19 DIAGNOSIS — C73 Malignant neoplasm of thyroid gland: Secondary | ICD-10-CM | POA: Insufficient documentation

## 2012-10-19 MED ORDER — SODIUM IODIDE I 131 CAPSULE
52.7000 | Freq: Once | INTRAVENOUS | Status: AC | PRN
  Administered 2012-10-19: 52.7 via ORAL

## 2012-11-24 ENCOUNTER — Other Ambulatory Visit: Payer: Self-pay

## 2013-02-01 ENCOUNTER — Encounter (INDEPENDENT_AMBULATORY_CARE_PROVIDER_SITE_OTHER): Payer: Self-pay | Admitting: General Surgery

## 2013-02-01 ENCOUNTER — Ambulatory Visit (INDEPENDENT_AMBULATORY_CARE_PROVIDER_SITE_OTHER): Payer: Medicare Other | Admitting: General Surgery

## 2013-02-01 VITALS — BP 136/74 | HR 72 | Temp 97.2°F | Resp 16 | Ht 61.0 in | Wt 135.4 lb

## 2013-02-01 DIAGNOSIS — E042 Nontoxic multinodular goiter: Secondary | ICD-10-CM

## 2013-02-01 DIAGNOSIS — C73 Malignant neoplasm of thyroid gland: Secondary | ICD-10-CM

## 2013-02-01 NOTE — Progress Notes (Signed)
Patient ID: Sherry Olsen, female   DOB: Mar 09, 1933, 79 y.o.   MRN: [id number] History: This patient underwent left tyroid lobectomy because of multinodular goiter. 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 She did well after that surgery. She was returned to the operating room on November 21 and underwent completion right thyroid lobectomy and central compartment node sampling. She had no problems after that surgery. Final pathology showed a microfollicular adenoma, multiple, thyroiditis, one parathyroid tissue but no malignant features.  She has recovered from the surgery. Her voice is normal.She is back to full activities. She has no paresthesias. She has no swallowing problems. She is being followed closely by Dr. Juleen China.She has completed her radioiodine ablation and is on Synthroid 88 mcg daily, and is being followed by Dr. Juleen China.. She states that he plans imaging studies and blood work in August or September..  ROS: 10 systems review of systems is negative except as described above. She has chronic limited range of motion of her neck because of posterior fusion. She is otherwise fully active. She states her voice is normal.  Exam: Patient is well. No distress Voice is strong. Prostate signs negative bilaterally Neck incision well healed. No mass anterior neck, posterior triangle, posterior scalp. Supraclavicular. Negative exam  Assessment: Multifocal papillary thyroid carcinoma, status post total thyroidectomy and central compartment nodes sampling. Uneventful recovery Radioiodine  ablation has been completed  Plan: She will see Dr. Juleen China for blood work and imaging in September Return to see me in November.   [name]. Derrell Lolling, M.D., [facility] General and Minimally invasive Surgery Breast and Colorectal Surgery Office:   [phone] Pager:   [phone]

## 2013-02-01 NOTE — Patient Instructions (Signed)
Your physical exam today is normal. There is no evidence of any thyroid mass or enlarged lymph nodes. You seem to have done well with the surgery and the radioiodine ablation.  Keep all of yourregular appointment with Dr. Juleen China for blood and x-ray tests.  Return to see Dr. Derrell Lolling in November.

## 2013-04-16 ENCOUNTER — Other Ambulatory Visit: Payer: Self-pay | Admitting: Otolaryngology

## 2013-04-16 DIAGNOSIS — H9319 Tinnitus, unspecified ear: Secondary | ICD-10-CM

## 2013-04-29 ENCOUNTER — Ambulatory Visit
Admission: RE | Admit: 2013-04-29 | Discharge: 2013-04-29 | Disposition: A | Discharge status: Home / Self Care | Payer: Medicare Other | Source: Ambulatory Visit | Attending: Otolaryngology | Admitting: Otolaryngology

## 2013-04-29 DIAGNOSIS — H9319 Tinnitus, unspecified ear: Secondary | ICD-10-CM

## 2013-04-29 MED ORDER — GADOBENATE DIMEGLUMINE 529 MG/ML IV SOLN
12.0000 mL | Freq: Once | INTRAVENOUS | Status: AC | PRN
  Administered 2013-04-29: 12 mL via INTRAVENOUS

## 2013-05-15 ENCOUNTER — Other Ambulatory Visit: Payer: Self-pay

## 2013-06-03 ENCOUNTER — Other Ambulatory Visit (HOSPITAL_COMMUNITY): Payer: Self-pay | Admitting: Endocrinology

## 2013-06-03 DIAGNOSIS — C73 Malignant neoplasm of thyroid gland: Secondary | ICD-10-CM

## 2013-06-17 ENCOUNTER — Encounter (HOSPITAL_COMMUNITY)
Admission: RE | Admit: 2013-06-17 | Discharge: 2013-06-17 | Disposition: A | Discharge status: Home / Self Care | Payer: Medicare Other | Source: Ambulatory Visit | Attending: Endocrinology | Admitting: Endocrinology

## 2013-06-17 DIAGNOSIS — C73 Malignant neoplasm of thyroid gland: Secondary | ICD-10-CM | POA: Insufficient documentation

## 2013-06-17 MED ORDER — THYROTROPIN ALFA 1.1 MG IM SOLR
0.9000 mg | INTRAMUSCULAR | Status: AC
  Administered 2013-06-17: 0.9 mg via INTRAMUSCULAR

## 2013-06-18 ENCOUNTER — Encounter (HOSPITAL_COMMUNITY): Payer: Medicare Other | Attending: Endocrinology

## 2013-06-18 DIAGNOSIS — C73 Malignant neoplasm of thyroid gland: Secondary | ICD-10-CM | POA: Insufficient documentation

## 2013-06-18 MED ORDER — THYROTROPIN ALFA 1.1 MG IM SOLR
0.9000 mg | INTRAMUSCULAR | Status: AC
  Administered 2013-06-18: 0.9 mg via INTRAMUSCULAR

## 2013-06-19 ENCOUNTER — Encounter (HOSPITAL_COMMUNITY)
Admission: RE | Admit: 2013-06-19 | Discharge: 2013-06-19 | Disposition: A | Discharge status: Home / Self Care | Payer: Medicare Other | Source: Ambulatory Visit | Attending: Endocrinology | Admitting: Endocrinology

## 2013-06-19 DIAGNOSIS — C73 Malignant neoplasm of thyroid gland: Secondary | ICD-10-CM | POA: Insufficient documentation

## 2013-06-19 MED ORDER — SODIUM IODIDE I 131 CAPSULE
4.0000 | Freq: Once | INTRAVENOUS | Status: AC | PRN
  Administered 2013-06-19: 4 via ORAL

## 2013-06-21 ENCOUNTER — Encounter (HOSPITAL_COMMUNITY)
Admission: RE | Admit: 2013-06-21 | Discharge: 2013-06-21 | Disposition: A | Discharge status: Home / Self Care | Payer: Medicare Other | Source: Ambulatory Visit | Attending: Endocrinology | Admitting: Endocrinology

## 2013-06-21 DIAGNOSIS — C73 Malignant neoplasm of thyroid gland: Secondary | ICD-10-CM | POA: Insufficient documentation

## 2013-08-15 ENCOUNTER — Other Ambulatory Visit: Payer: Self-pay

## 2013-09-24 ENCOUNTER — Ambulatory Visit (INDEPENDENT_AMBULATORY_CARE_PROVIDER_SITE_OTHER): Payer: Medicare Other | Admitting: General Surgery

## 2013-09-24 ENCOUNTER — Encounter (INDEPENDENT_AMBULATORY_CARE_PROVIDER_SITE_OTHER): Payer: Self-pay | Admitting: General Surgery

## 2013-09-24 VITALS — BP 124/70 | HR 60 | Temp 97.6°F | Resp 18 | Ht 61.25 in | Wt 137.8 lb

## 2013-09-24 DIAGNOSIS — C73 Malignant neoplasm of thyroid gland: Secondary | ICD-10-CM

## 2013-09-24 NOTE — Progress Notes (Signed)
Patient ID: Sherry Olsen, female   DOB: 03/04/1933, 79 y.o.   MRN: [id number]  History:  This patient underwent left thyroid lobectomy because of multinodular goiter on 08/08/2012. 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 She did well after that surgery. She was returned to the operating room on November 21 and underwent completion right thyroid lobectomy and central compartment node sampling. She had no problems after that surgery. Final pathology showed a microfollicular adenoma, multiple, thyroiditis, one node but no malignant features.  She has recovered from the surgery. Her voice is normal.She is back to full activities. She has no paresthesias. She has no swallowing problems. She is being followed closely by Dr. Juleen China.She has completed her radioiodine ablation and is on Synthroid 88 mcg daily, and is being followed by Dr. Juleen China. An I- 131, thymogen  scan on 06/03/2013 was negative. No sign of metastatic disease. She states all of her blood work is normal as well. She plans to followup with Dr. Juleen China about every 6 months.. He will continue to regulate her Synthroid dose.  ROS: 10 systems review of systems is negative except as described above. She has chronic limited range of motion of her neck because of posterior fusion. She is otherwise fully active. She states her voice is normal.    Exam:  Patient is well. No distress  Voice is strong.  Neck incision well healed. No mass anterior neck, posterior triangle, posterior scalp. Supraclavicular. Negative exam  Lungs clear to auscultation bilaterally Heart regular rate and rhythm. No murmur. No ectopy.  Assessment:  Multifocal papillary thyroid carcinoma, status post total thyroidectomy and central compartment nodes sampling. Uneventful recovery  Radioiodine ablation has been completed  No evidence of locally recurrent or metastatic disease  Plan: She will see Dr. Juleen China  every 6 months. We discussed the role of followup with me, which we decided would not be necessary, and so she will graduate from my care care. I will standby should further problems arise.Marland Kitchen    [name]. Derrell Lolling, M.D., [facility]  General and Minimally invasive Surgery  Breast and Colorectal Surgery  Office: [phone]  Pager: [phone]

## 2013-09-24 NOTE — Patient Instructions (Signed)
Your physical exam today is normal. There is no sign of any abnormal lymph nodes. There is no sign of any thyroid cancer.  All of your tests and studies that Dr. Juleen China is performing are also normal.  Continue regular followup with Dr. Juleen China every 6 months. He will do blood work and scans periodically from here on out.  Return to see Dr. Derrell Lolling if needed.

## 2014-05-19 ENCOUNTER — Ambulatory Visit (INDEPENDENT_AMBULATORY_CARE_PROVIDER_SITE_OTHER): Payer: Medicare Other | Admitting: Radiology

## 2014-05-19 DIAGNOSIS — R259 Unspecified abnormal involuntary movements: Secondary | ICD-10-CM

## 2014-05-19 NOTE — Procedures (Signed)
   HISTORY: 80 years old female, with body tremor, rule out seizure  TECHNIQUE:  16 channel EEG was performed based on standard 10-16 international system. One channel was dedicated to EKG, which has demonstrates normal sinus rhythm of 66 beats per minutes.  Upon awakening, the posterior background activity was well-developed, in alpha range, 10 Hz with amplitude of 50 microvoltage, reactive to eye opening and closure.  There was no evidence of epileptiform discharge.  Photic stimulation was performed, which induced a symmetric photic driving.  Hyperventilation was performed, there was no abnormality elicit.  No sleep was achieved. The patient was drowsy during recording  CONCLUSION: This is a  normal awake EEG.  There is no electrodiagnostic evidence of epileptiform discharge

## 2014-06-06 ENCOUNTER — Other Ambulatory Visit: Payer: Self-pay | Admitting: Urology

## 2014-06-18 ENCOUNTER — Other Ambulatory Visit (HOSPITAL_COMMUNITY): Payer: Self-pay | Admitting: Endocrinology

## 2014-06-18 DIAGNOSIS — C73 Malignant neoplasm of thyroid gland: Secondary | ICD-10-CM

## 2014-06-23 ENCOUNTER — Encounter (HOSPITAL_COMMUNITY)
Admission: RE | Admit: 2014-06-23 | Discharge: 2014-06-23 | Disposition: A | Discharge status: Home / Self Care | Payer: Medicare Other | Source: Ambulatory Visit | Attending: Endocrinology | Admitting: Endocrinology

## 2014-06-23 DIAGNOSIS — C73 Malignant neoplasm of thyroid gland: Secondary | ICD-10-CM | POA: Insufficient documentation

## 2014-06-23 MED ORDER — THYROTROPIN ALFA 1.1 MG IM SOLR
0.9000 mg | INTRAMUSCULAR | Status: AC
  Administered 2014-06-23: 0.9 mg via INTRAMUSCULAR

## 2014-06-24 ENCOUNTER — Encounter (HOSPITAL_COMMUNITY)
Admission: RE | Admit: 2014-06-24 | Discharge: 2014-06-24 | Disposition: A | Discharge status: Home / Self Care | Payer: Medicare Other | Source: Ambulatory Visit | Attending: Endocrinology | Admitting: Endocrinology

## 2014-06-24 DIAGNOSIS — C73 Malignant neoplasm of thyroid gland: Secondary | ICD-10-CM | POA: Diagnosis not present

## 2014-06-24 MED ORDER — THYROTROPIN ALFA 1.1 MG IM SOLR
0.9000 mg | INTRAMUSCULAR | Status: AC
  Administered 2014-06-24: 0.9 mg via INTRAMUSCULAR

## 2014-06-25 ENCOUNTER — Encounter (HOSPITAL_COMMUNITY)
Admission: RE | Admit: 2014-06-25 | Discharge: 2014-06-25 | Disposition: A | Discharge status: Home / Self Care | Payer: Medicare Other | Source: Ambulatory Visit | Attending: Endocrinology | Admitting: Endocrinology

## 2014-06-25 MED ORDER — SODIUM IODIDE I 131 CAPSULE
4.0000 | Freq: Once | INTRAVENOUS | Status: AC | PRN
  Administered 2014-06-25: 4 via ORAL

## 2014-06-27 ENCOUNTER — Encounter (HOSPITAL_COMMUNITY)
Admission: RE | Admit: 2014-06-27 | Discharge: 2014-06-27 | Disposition: A | Discharge status: Home / Self Care | Payer: Medicare Other | Source: Ambulatory Visit | Attending: Endocrinology | Admitting: Endocrinology

## 2014-06-27 DIAGNOSIS — C73 Malignant neoplasm of thyroid gland: Secondary | ICD-10-CM | POA: Diagnosis not present

## 2014-06-30 ENCOUNTER — Encounter (HOSPITAL_COMMUNITY): Payer: Self-pay | Admitting: Pharmacy Technician

## 2014-07-09 ENCOUNTER — Ambulatory Visit (HOSPITAL_COMMUNITY)
Admission: RE | Admit: 2014-07-09 | Discharge: 2014-07-09 | Disposition: A | Discharge status: Home / Self Care | Payer: Medicare Other | Source: Ambulatory Visit | Attending: Anesthesiology | Admitting: Anesthesiology

## 2014-07-09 ENCOUNTER — Encounter (HOSPITAL_COMMUNITY)
Admission: RE | Admit: 2014-07-09 | Discharge: 2014-07-09 | Disposition: A | Discharge status: Home / Self Care | Payer: Medicare Other | Source: Ambulatory Visit | Attending: Obstetrics and Gynecology | Admitting: Obstetrics and Gynecology

## 2014-07-09 ENCOUNTER — Encounter (HOSPITAL_COMMUNITY): Payer: Self-pay

## 2014-07-09 DIAGNOSIS — I498 Other specified cardiac arrhythmias: Secondary | ICD-10-CM | POA: Diagnosis not present

## 2014-07-09 DIAGNOSIS — M47817 Spondylosis without myelopathy or radiculopathy, lumbosacral region: Secondary | ICD-10-CM | POA: Insufficient documentation

## 2014-07-09 DIAGNOSIS — Z8673 Personal history of transient ischemic attack (TIA), and cerebral infarction without residual deficits: Secondary | ICD-10-CM | POA: Insufficient documentation

## 2014-07-09 DIAGNOSIS — I517 Cardiomegaly: Secondary | ICD-10-CM | POA: Insufficient documentation

## 2014-07-09 DIAGNOSIS — Z01818 Encounter for other preprocedural examination: Secondary | ICD-10-CM | POA: Insufficient documentation

## 2014-07-09 DIAGNOSIS — R259 Unspecified abnormal involuntary movements: Secondary | ICD-10-CM | POA: Diagnosis not present

## 2014-07-09 DIAGNOSIS — R9431 Abnormal electrocardiogram [ECG] [EKG]: Secondary | ICD-10-CM | POA: Diagnosis not present

## 2014-07-09 HISTORY — DX: Essential (primary) hypertension: I10

## 2014-07-09 HISTORY — DX: Tinnitus, unspecified ear: H93.19

## 2014-07-09 LAB — CBC
HCT: 44.8 % (ref 36.0–46.0)
Hemoglobin: 14.5 g/dL (ref 12.0–15.0)
MCH: 29.7 pg (ref 26.0–34.0)
MCHC: 32.4 g/dL (ref 30.0–36.0)
MCV: 91.8 fL (ref 78.0–100.0)
PLATELETS: 308 10*3/uL (ref 150–400)
RBC: 4.88 MIL/uL (ref 3.87–5.11)
RDW: 13.6 % (ref 11.5–15.5)
WBC: 6.6 10*3/uL (ref 4.0–10.5)

## 2014-07-09 LAB — BASIC METABOLIC PANEL
Anion gap: 8 (ref 5–15)
BUN: 7 mg/dL (ref 6–23)
CO2: 28 mEq/L (ref 19–32)
Calcium: 9.5 mg/dL (ref 8.4–10.5)
Chloride: 97 mEq/L (ref 96–112)
Creatinine, Ser: 0.61 mg/dL (ref 0.50–1.10)
GFR, EST NON AFRICAN AMERICAN: 84 mL/min — AB (ref >90)
Glucose, Bld: 93 mg/dL (ref 70–99)
Potassium: 5.7 mEq/L — ABNORMAL HIGH (ref 3.7–5.3)
SODIUM: 133 meq/L — AB (ref 137–147)

## 2014-07-09 NOTE — Progress Notes (Signed)
Faxed BMP results to Dr Radene Knee and Dr Gaynelle Arabian.

## 2014-07-09 NOTE — Progress Notes (Signed)
EKG and CXR ordered during PAT visit 07/09/2014 due to pt stating BP flucuates ranges systolically between 325'Q to 160's pt has tremors with increased BP pt is on bystolic
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
## 2014-07-09 NOTE — Patient Instructions (Signed)
[place]  07/09/2014   Your procedure is scheduled on:    07/14/2014  Report to [facility] Main Entrance and follow signs to  [place] arrive at [place] AM.   Call this number if you have problems the morning of surgery [phone] or Presurgical Testing [phone].   Remember:  Do not eat food or drink liquids :After Midnight.  For Living Will and/or Health Care Power Attorney Forms: please provide copy for your medical record, may bring AM of surgery (forms should be already notarized-we do not provide this service).    Take these medicines the morning of surgery with A SIP OF WATER: Calcitonin (bring with you day of surgery);Valium if needed;Flonase (bring with you day of surgery);Restasis (bring with you day of surgery);Prevacid;Bystolic;Synthyroid.                               You may not have any metal on your body including hair pins and piercings  Do not wear jewelry, make-up, lotions, powders, or deodorant.  Do not shave body hair  48 hours(2 days) of CHG soap use.                Do not bring valuables to the hospital. [place].  Contacts, dentures or bridgework may not be worn into surgery.  Leave suitcase in the car. After surgery it may be brought to your room.  For patients admitted to the hospital, checkout time is 11:00 AM the day of discharge.    Remember: Type/Screen "Blue armsbands"- may not be removed once applied(would result in being retested AM of surgery, if removed). ________________________________________________________________________  [facility] - Preparing for Surgery Before surgery, you can play an important role.  Because skin is not sterile, your skin needs to be as free of germs as possible.  You can reduce the number of germs on your skin by washing with CHG (chlorahexidine gluconate) soap before surgery.  CHG is an antiseptic cleaner which kills germs and bonds with the skin to continue  killing germs even after washing. Please DO NOT use if you have an allergy to CHG or antibacterial soaps.  If your skin becomes reddened/irritated stop using the CHG and inform your nurse when you arrive at Short Stay. Do not shave (including legs and underarms) for at least 48 hours prior to the first CHG shower.  You may shave your face/neck. Please follow these instructions carefully:  1.  Shower with CHG Soap the night before surgery and the  morning of Surgery.  2.  If you choose to wash your hair, wash your hair first as usual with your  normal  shampoo.  3.  After you shampoo, rinse your hair and body thoroughly to remove the  shampoo.                           4.  Use CHG as you would any other liquid soap.  You can apply chg directly  to the skin and wash                       Gently with a scrungie or clean washcloth.  5.  Apply the CHG Soap to your body ONLY FROM THE NECK DOWN.   Do not use on face/ open  Wound or open sores. Avoid contact with eyes, ears mouth and genitals (private parts).                       Wash face,  Genitals (private parts) with your normal soap.             6.  Wash thoroughly, paying special attention to the area where your surgery  will be performed.  7.  Thoroughly rinse your body with warm water from the neck down.  8.  DO NOT shower/wash with your normal soap after using and rinsing off  the CHG Soap.                9.  Pat yourself dry with a clean towel.            10.  Wear clean pajamas.            11.  Place clean sheets on your bed the night of your first shower and do not  sleep with pets. Day of Surgery : Do not apply any lotions/deodorants the morning of surgery.  Please wear clean clothes to the hospital/surgery center.  FAILURE TO FOLLOW THESE INSTRUCTIONS MAY RESULT IN THE CANCELLATION OF YOUR SURGERY PATIENT SIGNATURE_________________________________  NURSE  SIGNATURE__________________________________  ________________________________________________________________________

## 2014-07-09 NOTE — H&P (Signed)
  Patient name  Sherry Olsen, Sherry Olsen DICTATION#  [id number] CSN# [id number]  Darlyn Chamber, MD 07/09/2014 2:00 PM

## 2014-07-10 NOTE — H&P (Signed)
NAME:  Sherry Olsen, Sherry Olsen NO.:  [id number]  MEDICAL RECORD NO.:  [id number]  LOCATION:                                 FACILITY:  PHYSICIAN:  Darlyn Chamber, M.D.   DATE OF BIRTH:  [birth year]  DATE OF ADMISSION:  07/14/2014 DATE OF DISCHARGE:                             HISTORY & PHYSICAL   The patient is a 80 year old postmenopausal patient who is admitted for surgical management of symptomatic pelvic relaxation.  The patient has been troubled with increasing pelvic relaxation for mainly of the cystocele with uterine descensus.  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  She has seen Dr. Gaynelle Arabian in consultation.  He will be doing the vaginal repair work as well as a sling under the urethra.  Our procedure will be a laparoscopic- assisted vaginal hysterectomy and bilateral salpingo-oophorectomy.  We have tried other options such as pessary which has not worked. Therefore, this is our next procedure.  ALLERGIES:  She has no known drug allergies listed.  MEDICATIONS:  She is on Benicar 20 mg daily, Bystolic 5 mg daily, calcitonin 1 daily, Lipitor 5 mg daily, Prevacid 30 mg daily, Restasis 2 times daily, Synthroid 88 mcg daily.  PAST MEDICAL HISTORY:  She has got a history of thyroid cancer, had a thyroidectomy done and is on Synthroid replacement.  She also has a history of diverticulosis, osteoporosis, and irritable bowel syndrome. She is also being managed for hyperlipidemia as well as hypertension.  PAST SURGICAL HISTORY:  She had tonsillectomy and adenoidectomy in 1942. She had a previous Bartholin's cyst removed.  She had a cervical fusion of C1 through 4 in 1966, gallbladder removed in 1977, hemorrhoidectomy in 2007, thyroid removed in 2013, and she has had 5 vaginal deliveries.  FAMILY HISTORY:  Father with a history of colon cancer.  Otherwise, unremarkable.  SOCIAL HISTORY:  There is no tobacco, minimal  alcohol use.  REVIEW OF SYSTEMS:  Noncontributory.  PHYSICAL EXAMINATION:  VITAL SIGNS:  The patient is afebrile with stable vital signs. HEENT:  The patient is normocephalic.  Pupils equal, round, and reactive to light and accommodation.  Extraocular motions were intact.  Sclerae and conjunctivae were clear.  Oropharynx clear. NECK:  Does have a previous thyroidectomy scar, otherwise unremarkable. BREASTS:  Not examined. LUNGS:  Clear. CARDIOVASCULAR SYSTEM:  Regular rate without murmurs or gallops.  There was no carotid or abdominal bruits. ABDOMINAL EXAM:  Benign.  No mass, organomegaly, or tenderness. PELVIC:  Normal external genitalia.  Vaginal mucosa is clear.  She does have a prominent cystocele, actually bulges outside the introitus along with the cervix and uterus.  Posterior part looks normal.  Bimanual is unremarkable.  Uterus normal size and shape.  Adnexa unremarkable.  IMPRESSION: 1. Worsening pelvic relaxation with associated incontinence. 2. History of thyroidectomy for thyroid cancer. 3. Hypertension. 4. Hyperlipidemia.  PLAN:  Again, we are going to proceed with laparoscopic-assisted vaginal hysterectomy and bilateral salpingo-oophorectomy.  Dr. Gaynelle Arabian will come in and will proceed with repair of the cystocele, any repair underneath the urethra or any posterior repair.  The risks of surgery have been discussed including the risk of infection.  The risk of hemorrhage that could require transfusion with the risk of AIDS or hepatitis.  Risk of injury to adjacent organs including bladder, bowel, ureters that could require further exploratory surgery.  Risk of deep venous thrombosis and pulmonary embolus.  The patient expressed understanding of indications and risks and other options.     Darlyn Chamber, M.D.     JSM/MEDQ  D:  07/09/2014  T:  07/09/2014  Job:  [id number]

## 2014-07-13 MED ORDER — GENTAMICIN SULFATE 40 MG/ML IJ SOLN
INTRAVENOUS | Status: DC
  Filled 2014-07-13: qty 7.5

## 2014-07-13 NOTE — Anesthesia Preprocedure Evaluation (Addendum)
Anesthesia Evaluation  Patient identified by MRN, date of birth, ID band Patient awake    Reviewed: Allergy & Precautions, H&P , NPO status , Patient's Chart, lab work & pertinent test results  History of Anesthesia Complications (+) DIFFICULT AIRWAY  Airway Mallampati: III  TM Distance: >3 FB Neck ROM: Limited    Dental  (+) Dental Advisory Given, Teeth Intact   Pulmonary pneumonia -, resolved,  breath sounds clear to auscultation  Pulmonary exam normal       Cardiovascular Exercise Tolerance: Good hypertension, + CAD and + Past MI Rhythm:Regular Rate:Normal  Old MI on ECG   Neuro/Psych  Headaches, Cervical fusion negative psych ROS   GI/Hepatic Neg liver ROS, GERD-  Medicated,  Endo/Other  Hypothyroidism   Renal/GU negative Renal ROS     Musculoskeletal negative musculoskeletal ROS (+)   Abdominal   Peds  Hematology negative hematology ROS (+)   Anesthesia Other Findings   Reproductive/Obstetrics                             Anesthesia Physical  Anesthesia Plan  ASA: III  Anesthesia Plan: General   Post-op Pain Management:    Induction: Intravenous  Airway Management Planned: Oral ETT and Video Laryngoscope Planned  Additional Equipment:   Intra-op Plan:   Post-operative Plan: Extubation in OR  Informed Consent:   Plan Discussed with: Surgeon  Anesthesia Plan Comments:         Anesthesia Quick Evaluation  

## 2014-07-14 ENCOUNTER — Ambulatory Visit (HOSPITAL_COMMUNITY): Payer: Medicare Other | Admitting: Anesthesiology

## 2014-07-14 ENCOUNTER — Inpatient Hospital Stay: Admit: 2014-07-14 | Payer: Self-pay | Admitting: Obstetrics and Gynecology

## 2014-07-14 ENCOUNTER — Encounter (HOSPITAL_COMMUNITY)
Admission: RE | Disposition: A | Discharge status: Home / Self Care | Payer: Self-pay | Source: Ambulatory Visit | Attending: Urology

## 2014-07-14 ENCOUNTER — Encounter (HOSPITAL_COMMUNITY): Payer: Self-pay | Admitting: Certified Registered Nurse Anesthetist

## 2014-07-14 ENCOUNTER — Encounter (HOSPITAL_COMMUNITY): Payer: Medicare Other | Admitting: Anesthesiology

## 2014-07-14 ENCOUNTER — Inpatient Hospital Stay (HOSPITAL_COMMUNITY)
Admission: RE | Admit: 2014-07-14 | Discharge: 2014-07-18 | DRG: 742 | Disposition: A | Discharge status: Home / Self Care | Payer: Medicare Other | Source: Ambulatory Visit | Attending: Urology | Admitting: Urology

## 2014-07-14 ENCOUNTER — Ambulatory Visit (HOSPITAL_COMMUNITY): Payer: Medicare Other

## 2014-07-14 DIAGNOSIS — K219 Gastro-esophageal reflux disease without esophagitis: Secondary | ICD-10-CM | POA: Diagnosis present

## 2014-07-14 DIAGNOSIS — R3916 Straining to void: Secondary | ICD-10-CM | POA: Diagnosis present

## 2014-07-14 DIAGNOSIS — Z79899 Other long term (current) drug therapy: Secondary | ICD-10-CM

## 2014-07-14 DIAGNOSIS — R3911 Hesitancy of micturition: Secondary | ICD-10-CM | POA: Diagnosis present

## 2014-07-14 DIAGNOSIS — N813 Complete uterovaginal prolapse: Principal | ICD-10-CM | POA: Diagnosis present

## 2014-07-14 DIAGNOSIS — R350 Frequency of micturition: Secondary | ICD-10-CM | POA: Diagnosis present

## 2014-07-14 DIAGNOSIS — M810 Age-related osteoporosis without current pathological fracture: Secondary | ICD-10-CM | POA: Diagnosis present

## 2014-07-14 DIAGNOSIS — N814 Uterovaginal prolapse, unspecified: Secondary | ICD-10-CM | POA: Diagnosis present

## 2014-07-14 DIAGNOSIS — K567 Ileus, unspecified: Secondary | ICD-10-CM

## 2014-07-14 DIAGNOSIS — Z888 Allergy status to other drugs, medicaments and biological substances status: Secondary | ICD-10-CM

## 2014-07-14 DIAGNOSIS — Z800 Family history of malignant neoplasm of digestive organs: Secondary | ICD-10-CM

## 2014-07-14 DIAGNOSIS — Z881 Allergy status to other antibiotic agents status: Secondary | ICD-10-CM

## 2014-07-14 DIAGNOSIS — M1990 Unspecified osteoarthritis, unspecified site: Secondary | ICD-10-CM | POA: Diagnosis present

## 2014-07-14 DIAGNOSIS — N3641 Hypermobility of urethra: Secondary | ICD-10-CM | POA: Diagnosis present

## 2014-07-14 DIAGNOSIS — Y836 Removal of other organ (partial) (total) as the cause of abnormal reaction of the patient, or of later complication, without mention of misadventure at the time of the procedure: Secondary | ICD-10-CM | POA: Diagnosis not present

## 2014-07-14 DIAGNOSIS — N819 Female genital prolapse, unspecified: Secondary | ICD-10-CM

## 2014-07-14 DIAGNOSIS — N393 Stress incontinence (female) (male): Secondary | ICD-10-CM | POA: Diagnosis present

## 2014-07-14 DIAGNOSIS — Z880 Allergy status to penicillin: Secondary | ICD-10-CM

## 2014-07-14 DIAGNOSIS — Z9049 Acquired absence of other specified parts of digestive tract: Secondary | ICD-10-CM | POA: Diagnosis present

## 2014-07-14 DIAGNOSIS — Z981 Arthrodesis status: Secondary | ICD-10-CM

## 2014-07-14 DIAGNOSIS — I10 Essential (primary) hypertension: Secondary | ICD-10-CM | POA: Diagnosis present

## 2014-07-14 DIAGNOSIS — Z7982 Long term (current) use of aspirin: Secondary | ICD-10-CM

## 2014-07-14 DIAGNOSIS — K589 Irritable bowel syndrome without diarrhea: Secondary | ICD-10-CM | POA: Diagnosis present

## 2014-07-14 DIAGNOSIS — Z882 Allergy status to sulfonamides status: Secondary | ICD-10-CM

## 2014-07-14 DIAGNOSIS — Y839 Surgical procedure, unspecified as the cause of abnormal reaction of the patient, or of later complication, without mention of misadventure at the time of the procedure: Secondary | ICD-10-CM | POA: Diagnosis not present

## 2014-07-14 DIAGNOSIS — G629 Polyneuropathy, unspecified: Secondary | ICD-10-CM | POA: Diagnosis present

## 2014-07-14 DIAGNOSIS — K9189 Other postprocedural complications and disorders of digestive system: Secondary | ICD-10-CM

## 2014-07-14 DIAGNOSIS — E785 Hyperlipidemia, unspecified: Secondary | ICD-10-CM | POA: Diagnosis present

## 2014-07-14 DIAGNOSIS — N816 Rectocele: Secondary | ICD-10-CM | POA: Diagnosis present

## 2014-07-14 DIAGNOSIS — Z85850 Personal history of malignant neoplasm of thyroid: Secondary | ICD-10-CM

## 2014-07-14 DIAGNOSIS — H3530 Unspecified macular degeneration: Secondary | ICD-10-CM | POA: Diagnosis present

## 2014-07-14 DIAGNOSIS — Z8672 Personal history of thrombophlebitis: Secondary | ICD-10-CM

## 2014-07-14 DIAGNOSIS — N3942 Incontinence without sensory awareness: Secondary | ICD-10-CM | POA: Diagnosis present

## 2014-07-14 DIAGNOSIS — G839 Paralytic syndrome, unspecified: Secondary | ICD-10-CM | POA: Diagnosis not present

## 2014-07-14 HISTORY — PX: LAPAROSCOPIC ASSISTED VAGINAL HYSTERECTOMY: SHX5398

## 2014-07-14 HISTORY — PX: RECTOCELE REPAIR: SHX761

## 2014-07-14 HISTORY — PX: PUBOVAGINAL SLING: SHX1035

## 2014-07-14 HISTORY — PX: CYSTOSCOPY WITH RETROGRADE PYELOGRAM, URETEROSCOPY AND STENT PLACEMENT: SHX5789

## 2014-07-14 LAB — POCT I-STAT 4, (NA,K, GLUC, HGB,HCT)
Glucose, Bld: 102 mg/dL — ABNORMAL HIGH (ref 70–99)
HCT: 45 % (ref 36.0–46.0)
Hemoglobin: 15.3 g/dL — ABNORMAL HIGH (ref 12.0–15.0)
POTASSIUM: 4.8 meq/L (ref 3.7–5.3)
SODIUM: 134 meq/L — AB (ref 137–147)

## 2014-07-14 LAB — CBC
HEMATOCRIT: 39 % (ref 36.0–46.0)
HEMOGLOBIN: 12.7 g/dL (ref 12.0–15.0)
MCH: 29.2 pg (ref 26.0–34.0)
MCHC: 32.6 g/dL (ref 30.0–36.0)
MCV: 89.7 fL (ref 78.0–100.0)
Platelets: 250 10*3/uL (ref 150–400)
RBC: 4.35 MIL/uL (ref 3.87–5.11)
RDW: 13.2 % (ref 11.5–15.5)
WBC: 12.6 10*3/uL — AB (ref 4.0–10.5)

## 2014-07-14 LAB — CREATININE, SERUM
Creatinine, Ser: 0.52 mg/dL (ref 0.50–1.10)
GFR calc Af Amer: >90 mL/min (ref >90)
GFR calc non Af Amer: 89 mL/min — ABNORMAL LOW (ref >90)

## 2014-07-14 SURGERY — HYSTERECTOMY, VAGINAL, LAPAROSCOPY-ASSISTED
Anesthesia: General

## 2014-07-14 MED ORDER — SODIUM CHLORIDE 0.9 % IR SOLN
Status: AC
  Filled 2014-07-14: qty 1

## 2014-07-14 MED ORDER — DIPHENHYDRAMINE HCL 12.5 MG/5ML PO ELIX: 12.5000 mg | ORAL_SOLUTION | Freq: Four times a day (QID) | ORAL | Status: DC | PRN

## 2014-07-14 MED ORDER — NEOSTIGMINE METHYLSULFATE 10 MG/10ML IV SOLN
INTRAVENOUS | Status: AC
  Filled 2014-07-14: qty 1

## 2014-07-14 MED ORDER — CISATRACURIUM BESYLATE (PF) 10 MG/5ML IV SOLN
INTRAVENOUS | Status: DC | PRN
  Administered 2014-07-14 (×3): 2 mg via INTRAVENOUS
  Administered 2014-07-14: 10 mg via INTRAVENOUS

## 2014-07-14 MED ORDER — CLINDAMYCIN PHOSPHATE 300 MG/50ML IV SOLN
300.0000 mg | Freq: Once | INTRAVENOUS | Status: DC
  Filled 2014-07-14: qty 50

## 2014-07-14 MED ORDER — FENTANYL CITRATE 0.05 MG/ML IJ SOLN
INTRAMUSCULAR | Status: AC
  Filled 2014-07-14: qty 5

## 2014-07-14 MED ORDER — LACTATED RINGERS IV SOLN
INTRAVENOUS | Status: DC | PRN
  Administered 2014-07-14 (×3): via INTRAVENOUS

## 2014-07-14 MED ORDER — LIDOCAINE HCL 2 % EX GEL
CUTANEOUS | Status: AC
  Filled 2014-07-14: qty 10

## 2014-07-14 MED ORDER — DEXAMETHASONE SODIUM PHOSPHATE 10 MG/ML IJ SOLN
INTRAMUSCULAR | Status: DC | PRN
  Administered 2014-07-14: 10 mg via INTRAVENOUS

## 2014-07-14 MED ORDER — ENOXAPARIN SODIUM 40 MG/0.4ML ~~LOC~~ SOLN
40.0000 mg | SUBCUTANEOUS | Status: DC
  Administered 2014-07-15 – 2014-07-18 (×4): 40 mg via SUBCUTANEOUS
  Filled 2014-07-14 (×6): qty 0.4

## 2014-07-14 MED ORDER — CISATRACURIUM BESYLATE 20 MG/10ML IV SOLN
INTRAVENOUS | Status: AC
  Filled 2014-07-14: qty 10

## 2014-07-14 MED ORDER — POLYVINYL ALCOHOL 1.4 % OP SOLN: 1.0000 [drp] | Freq: Every evening | OPHTHALMIC | Status: DC | PRN

## 2014-07-14 MED ORDER — EPHEDRINE SULFATE 50 MG/ML IJ SOLN
INTRAMUSCULAR | Status: DC | PRN
  Administered 2014-07-14: 5 mg via INTRAVENOUS
  Administered 2014-07-14 (×2): 2.5 mg via INTRAVENOUS
  Administered 2014-07-14: 5 mg via INTRAVENOUS

## 2014-07-14 MED ORDER — ONDANSETRON HCL 4 MG/2ML IJ SOLN
INTRAMUSCULAR | Status: DC | PRN
  Administered 2014-07-14 (×2): 2 mg via INTRAVENOUS

## 2014-07-14 MED ORDER — BELLADONNA ALKALOIDS-OPIUM 16.2-60 MG RE SUPP
RECTAL | Status: AC
  Filled 2014-07-14: qty 1

## 2014-07-14 MED ORDER — PANTOPRAZOLE SODIUM 20 MG PO TBEC
20.0000 mg | DELAYED_RELEASE_TABLET | Freq: Every day | ORAL | Status: DC
  Administered 2014-07-15 – 2014-07-18 (×4): 20 mg via ORAL
  Filled 2014-07-14 (×4): qty 1

## 2014-07-14 MED ORDER — HYDROMORPHONE HCL 1 MG/ML IJ SOLN
0.2500 mg | INTRAMUSCULAR | Status: DC | PRN
  Administered 2014-07-14 (×4): 0.5 mg via INTRAVENOUS

## 2014-07-14 MED ORDER — LIDOCAINE HCL (CARDIAC) 20 MG/ML IV SOLN
INTRAVENOUS | Status: DC | PRN
  Administered 2014-07-14: 75 mg via INTRAVENOUS

## 2014-07-14 MED ORDER — FENTANYL CITRATE 0.05 MG/ML IJ SOLN
INTRAMUSCULAR | Status: DC | PRN
  Administered 2014-07-14 (×5): 50 ug via INTRAVENOUS

## 2014-07-14 MED ORDER — ONDANSETRON HCL 4 MG/2ML IJ SOLN
4.0000 mg | INTRAMUSCULAR | Status: DC | PRN
  Administered 2014-07-15 – 2014-07-16 (×2): 4 mg via INTRAVENOUS
  Filled 2014-07-14 (×2): qty 2

## 2014-07-14 MED ORDER — ACETAMINOPHEN 10 MG/ML IV SOLN
1000.0000 mg | Freq: Once | INTRAVENOUS | Status: AC
  Administered 2014-07-14: 1000 mg via INTRAVENOUS
  Filled 2014-07-14: qty 100

## 2014-07-14 MED ORDER — BUPIVACAINE-EPINEPHRINE (PF) 0.5% -1:200000 IJ SOLN
INTRAMUSCULAR | Status: DC | PRN
  Administered 2014-07-14: 70 mL

## 2014-07-14 MED ORDER — CYCLOSPORINE 0.05 % OP EMUL
1.0000 [drp] | Freq: Two times a day (BID) | OPHTHALMIC | Status: DC
  Administered 2014-07-15 – 2014-07-18 (×7): 1 [drp] via OPHTHALMIC
  Filled 2014-07-14 (×10): qty 1

## 2014-07-14 MED ORDER — CLINDAMYCIN PHOSPHATE 900 MG/50ML IV SOLN
900.0000 mg | Freq: Once | INTRAVENOUS | Status: AC
  Administered 2014-07-14: 900 mg via INTRAVENOUS

## 2014-07-14 MED ORDER — GENTAMICIN SULFATE 40 MG/ML IJ SOLN
5.0000 mg/kg | Freq: Once | INTRAVENOUS | Status: AC
  Administered 2014-07-15: 270 mg via INTRAVENOUS
  Filled 2014-07-14: qty 6.75

## 2014-07-14 MED ORDER — SUCCINYLCHOLINE CHLORIDE 20 MG/ML IJ SOLN
INTRAMUSCULAR | Status: DC | PRN
  Administered 2014-07-14: 130 mg via INTRAVENOUS

## 2014-07-14 MED ORDER — BUPIVACAINE HCL 0.25 % IJ SOLN
INTRAMUSCULAR | Status: AC
  Filled 2014-07-14: qty 1

## 2014-07-14 MED ORDER — PROPOFOL 10 MG/ML IV BOLUS
INTRAVENOUS | Status: DC | PRN
  Administered 2014-07-14: 170 mg via INTRAVENOUS

## 2014-07-14 MED ORDER — MIDAZOLAM HCL 2 MG/2ML IJ SOLN
INTRAMUSCULAR | Status: AC
  Filled 2014-07-14: qty 2

## 2014-07-14 MED ORDER — CLINDAMYCIN PHOSPHATE 900 MG/50ML IV SOLN
INTRAVENOUS | Status: AC
  Filled 2014-07-14: qty 50

## 2014-07-14 MED ORDER — ONDANSETRON HCL 4 MG/2ML IJ SOLN
INTRAMUSCULAR | Status: AC
  Filled 2014-07-14: qty 2

## 2014-07-14 MED ORDER — ESTRADIOL 0.1 MG/GM VA CREA
TOPICAL_CREAM | VAGINAL | Status: AC
  Filled 2014-07-14: qty 85

## 2014-07-14 MED ORDER — HYDROMORPHONE HCL 1 MG/ML IJ SOLN
INTRAMUSCULAR | Status: AC
  Filled 2014-07-14: qty 1

## 2014-07-14 MED ORDER — POLYETHYLENE GLYCOL 3350 17 G PO PACK
8.5000 g | PACK | Freq: Every day | ORAL | Status: DC
  Administered 2014-07-14 – 2014-07-17 (×3): 8.5 g via ORAL
  Filled 2014-07-14 (×5): qty 1

## 2014-07-14 MED ORDER — GENTAMICIN SULFATE 40 MG/ML IJ SOLN
5.0000 mg/kg | Freq: Once | INTRAVENOUS | Status: AC
  Administered 2014-07-14: 270 mg via INTRAVENOUS
  Filled 2014-07-14: qty 6.75

## 2014-07-14 MED ORDER — GLYCOPYRROLATE 0.2 MG/ML IJ SOLN
INTRAMUSCULAR | Status: DC | PRN
  Administered 2014-07-14: .2 mg via INTRAVENOUS
  Administered 2014-07-14: .6 mg via INTRAVENOUS

## 2014-07-14 MED ORDER — SODIUM CHLORIDE 0.9 % IR SOLN
Status: DC | PRN
  Administered 2014-07-14: 3000 mL via INTRAVESICAL

## 2014-07-14 MED ORDER — ACETAMINOPHEN 325 MG PO TABS: 650.0000 mg | ORAL_TABLET | ORAL | Status: DC | PRN

## 2014-07-14 MED ORDER — BUPIVACAINE HCL (PF) 0.25 % IJ SOLN
INTRAMUSCULAR | Status: DC | PRN
  Administered 2014-07-14: 9 mL

## 2014-07-14 MED ORDER — DIAZEPAM 5 MG PO TABS: 5.0000 mg | ORAL_TABLET | Freq: Once | ORAL | Status: AC | PRN

## 2014-07-14 MED ORDER — FLUTICASONE PROPIONATE 50 MCG/ACT NA SUSP
2.0000 | Freq: Every day | NASAL | Status: DC | PRN
  Filled 2014-07-14: qty 16

## 2014-07-14 MED ORDER — SIMETHICONE 80 MG PO CHEW
80.0000 mg | CHEWABLE_TABLET | Freq: Three times a day (TID) | ORAL | Status: DC | PRN
  Administered 2014-07-16: 80 mg via ORAL
  Filled 2014-07-14: qty 1

## 2014-07-14 MED ORDER — GLYCOPYRROLATE 0.2 MG/ML IJ SOLN
INTRAMUSCULAR | Status: AC
  Filled 2014-07-14: qty 3

## 2014-07-14 MED ORDER — NEOSTIGMINE METHYLSULFATE 10 MG/10ML IV SOLN
INTRAVENOUS | Status: DC | PRN
  Administered 2014-07-14: 5 mg via INTRAVENOUS

## 2014-07-14 MED ORDER — LIDOCAINE HCL 1 % IJ SOLN
INTRAMUSCULAR | Status: AC
  Filled 2014-07-14: qty 20

## 2014-07-14 MED ORDER — MIDAZOLAM HCL 5 MG/5ML IJ SOLN
INTRAMUSCULAR | Status: DC | PRN
  Administered 2014-07-14 (×2): 1 mg via INTRAVENOUS

## 2014-07-14 MED ORDER — ZOLPIDEM TARTRATE 5 MG PO TABS: 5.0000 mg | ORAL_TABLET | Freq: Every evening | ORAL | Status: DC | PRN

## 2014-07-14 MED ORDER — SODIUM CHLORIDE 0.9 % IJ SOLN
INTRAMUSCULAR | Status: AC
  Filled 2014-07-14: qty 10

## 2014-07-14 MED ORDER — LEVOTHYROXINE SODIUM 88 MCG PO TABS
88.0000 ug | ORAL_TABLET | Freq: Every day | ORAL | Status: DC
  Administered 2014-07-15 – 2014-07-18 (×4): 88 ug via ORAL
  Filled 2014-07-14 (×5): qty 1

## 2014-07-14 MED ORDER — SODIUM CHLORIDE 0.45 % IV SOLN
INTRAVENOUS | Status: DC
  Administered 2014-07-14 – 2014-07-18 (×7): via INTRAVENOUS

## 2014-07-14 MED ORDER — PROPOFOL 10 MG/ML IV BOLUS
INTRAVENOUS | Status: AC
  Filled 2014-07-14: qty 20

## 2014-07-14 MED ORDER — LIDOCAINE HCL (CARDIAC) 20 MG/ML IV SOLN
INTRAVENOUS | Status: AC
  Filled 2014-07-14: qty 5

## 2014-07-14 MED ORDER — METOCLOPRAMIDE HCL 5 MG/ML IJ SOLN
INTRAMUSCULAR | Status: DC | PRN
  Administered 2014-07-14: 5 mg via INTRAVENOUS

## 2014-07-14 MED ORDER — ESTRADIOL 0.1 MG/GM VA CREA
TOPICAL_CREAM | VAGINAL | Status: DC | PRN
  Administered 2014-07-14: 1 via VAGINAL

## 2014-07-14 MED ORDER — CALCIUM CARBONATE ANTACID 500 MG PO CHEW: 750.0000 mg | CHEWABLE_TABLET | Freq: Three times a day (TID) | ORAL | Status: DC | PRN

## 2014-07-14 MED ORDER — OXYCODONE-ACETAMINOPHEN 5-325 MG PO TABS
1.0000 | ORAL_TABLET | ORAL | Status: DC | PRN
  Administered 2014-07-14 – 2014-07-15 (×3): 1 via ORAL
  Administered 2014-07-15: 2 via ORAL
  Administered 2014-07-15: 1 via ORAL
  Administered 2014-07-16 – 2014-07-17 (×6): 2 via ORAL
  Administered 2014-07-18: 1 via ORAL
  Filled 2014-07-14: qty 2
  Filled 2014-07-14: qty 1
  Filled 2014-07-14 (×3): qty 2
  Filled 2014-07-14: qty 1
  Filled 2014-07-14: qty 2
  Filled 2014-07-14 (×2): qty 1
  Filled 2014-07-14 (×3): qty 2

## 2014-07-14 MED ORDER — EPHEDRINE SULFATE 50 MG/ML IJ SOLN
INTRAMUSCULAR | Status: AC
  Filled 2014-07-14: qty 1

## 2014-07-14 MED ORDER — BELLADONNA ALKALOIDS-OPIUM 16.2-60 MG RE SUPP
RECTAL | Status: DC | PRN
  Administered 2014-07-14: 1 via RECTAL

## 2014-07-14 MED ORDER — BISACODYL 10 MG RE SUPP
10.0000 mg | Freq: Every day | RECTAL | Status: DC | PRN
Start: 2014-07-14 — End: 2014-07-18
  Administered 2014-07-16 – 2014-07-17 (×2): 10 mg via RECTAL
  Filled 2014-07-14 (×2): qty 1

## 2014-07-14 MED ORDER — ATORVASTATIN CALCIUM 10 MG PO TABS
5.0000 mg | ORAL_TABLET | Freq: Every evening | ORAL | Status: DC
  Administered 2014-07-14 – 2014-07-17 (×4): 5 mg via ORAL
  Filled 2014-07-14 (×5): qty 0.5

## 2014-07-14 MED ORDER — IRBESARTAN 75 MG PO TABS
75.0000 mg | ORAL_TABLET | Freq: Every day | ORAL | Status: DC
  Administered 2014-07-16 – 2014-07-18 (×3): 75 mg via ORAL
  Filled 2014-07-14 (×5): qty 1

## 2014-07-14 MED ORDER — SODIUM CHLORIDE 0.9 % IR SOLN
Status: DC | PRN
  Administered 2014-07-14: 11:00:00

## 2014-07-14 MED ORDER — IOHEXOL 300 MG/ML  SOLN
INTRAMUSCULAR | Status: DC | PRN
  Administered 2014-07-14: 14 mL via URETHRAL

## 2014-07-14 MED ORDER — GENTAMICIN SULFATE 40 MG/ML IJ SOLN: 300.0000 mg | Freq: Once | INTRAVENOUS | Status: DC

## 2014-07-14 MED ORDER — DIPHENHYDRAMINE HCL 50 MG/ML IJ SOLN: 12.5000 mg | Freq: Four times a day (QID) | INTRAMUSCULAR | Status: DC | PRN

## 2014-07-14 MED ORDER — HYDROMORPHONE HCL 1 MG/ML IJ SOLN
0.5000 mg | INTRAMUSCULAR | Status: DC | PRN
  Administered 2014-07-14: 1 mg via INTRAVENOUS
  Administered 2014-07-15 – 2014-07-16 (×2): 0.5 mg via INTRAVENOUS
  Administered 2014-07-16: 1 mg via INTRAVENOUS
  Filled 2014-07-14 (×5): qty 1

## 2014-07-14 MED ORDER — LACTATED RINGERS IV SOLN: INTRAVENOUS | Status: DC

## 2014-07-14 MED ORDER — DICYCLOMINE HCL 10 MG PO CAPS
10.0000 mg | ORAL_CAPSULE | Freq: Every morning | ORAL | Status: DC
  Administered 2014-07-15 – 2014-07-18 (×4): 10 mg via ORAL
  Filled 2014-07-14 (×5): qty 1

## 2014-07-14 MED ORDER — DEXAMETHASONE SODIUM PHOSPHATE 10 MG/ML IJ SOLN
INTRAMUSCULAR | Status: AC
  Filled 2014-07-14: qty 1

## 2014-07-14 MED ORDER — BUPIVACAINE-EPINEPHRINE (PF) 0.5% -1:200000 IJ SOLN
INTRAMUSCULAR | Status: AC
  Filled 2014-07-14: qty 30

## 2014-07-14 MED ORDER — NEBIVOLOL HCL 2.5 MG PO TABS
2.5000 mg | ORAL_TABLET | Freq: Every morning | ORAL | Status: DC
  Administered 2014-07-15 – 2014-07-18 (×4): 2.5 mg via ORAL
  Filled 2014-07-14 (×4): qty 1

## 2014-07-14 MED ORDER — SODIUM CHLORIDE 0.9 % IJ SOLN
INTRAMUSCULAR | Status: AC
Start: 2014-07-14 — End: 2014-07-14
  Filled 2014-07-14: qty 10

## 2014-07-14 MED ORDER — SODIUM CHLORIDE 0.9 % IJ SOLN
INTRAMUSCULAR | Status: AC
  Filled 2014-07-14: qty 50

## 2014-07-14 SURGICAL SUPPLY — 96 items
ADH SKN CLS APL DERMABOND .7 (GAUZE/BANDAGES/DRESSINGS) ×2
BAG DECANTER FOR FLEXI CONT (MISCELLANEOUS) ×2 IMPLANT
BAG SPEC RTRVL LRG 6X4 10 (ENDOMECHANICALS)
BAG URINE DRAINAGE (UROLOGICAL SUPPLIES) ×4 IMPLANT
BAG URO CATCHER STRL LF (DRAPE) ×4 IMPLANT
BLADE HEX COATED 2.75 (ELECTRODE) ×6 IMPLANT
BLADE SURG 15 STRL LF DISP TIS (BLADE) ×4 IMPLANT
BLADE SURG 15 STRL SS (BLADE) ×8
BLADE SURG SZ10 CARB STEEL (BLADE) ×4 IMPLANT
CABLE HIGH FREQUENCY MONO STRZ (ELECTRODE) ×2 IMPLANT
CATH FOLEY 2WAY SLVR  5CC 16FR (CATHETERS) ×2
CATH FOLEY 2WAY SLVR  5CC 18FR (CATHETERS)
CATH FOLEY 2WAY SLVR 5CC 16FR (CATHETERS) ×2 IMPLANT
CATH FOLEY 2WAY SLVR 5CC 18FR (CATHETERS) ×2 IMPLANT
CATH INTERMIT  6FR 70CM (CATHETERS) ×6 IMPLANT
CATH ROBINSON RED A/P 16FR (CATHETERS) ×2 IMPLANT
CLOTH BEACON ORANGE TIMEOUT ST (SAFETY) ×4 IMPLANT
COVER MAYO STAND STRL (DRAPES) ×4 IMPLANT
COVER SURGICAL LIGHT HANDLE (MISCELLANEOUS) ×6 IMPLANT
DECANTER SPIKE VIAL GLASS SM (MISCELLANEOUS) ×6 IMPLANT
DERMABOND ADVANCED (GAUZE/BANDAGES/DRESSINGS) ×2
DERMABOND ADVANCED .7 DNX12 (GAUZE/BANDAGES/DRESSINGS) ×2 IMPLANT
DEVICE CAPIO SUTURING (INSTRUMENTS) ×2
DEVICE CAPIO SUTURING OPC (INSTRUMENTS) ×2 IMPLANT
DRAIN PENROSE 18X1/2 LTX STRL (DRAIN) ×2 IMPLANT
DRAIN PENROSE 18X1/4 LTX STRL (WOUND CARE) ×2 IMPLANT
DRAPE CAMERA CLOSED 9X96 (DRAPES) ×4 IMPLANT
DRAPE UTILITY 15X26 (DRAPE) ×4 IMPLANT
ELECT REM PT RETURN 9FT ADLT (ELECTROSURGICAL) ×8
ELECTRODE REM PT RTRN 9FT ADLT (ELECTROSURGICAL) ×2 IMPLANT
GAUZE PACKING 2X5 YD STRL (GAUZE/BANDAGES/DRESSINGS) ×4 IMPLANT
GAUZE SPONGE 4X4 16PLY XRAY LF (GAUZE/BANDAGES/DRESSINGS) ×4 IMPLANT
GLOVE BIO SURGEON STRL SZ7.5 (GLOVE) ×32 IMPLANT
GLOVE BIOGEL M STRL SZ7.5 (GLOVE) ×6 IMPLANT
GLOVE ECLIPSE 8.5 STRL (GLOVE) ×14 IMPLANT
GOWN STRL REUS W/TWL XL LVL3 (GOWN DISPOSABLE) ×16 IMPLANT
GUIDEWIRE STR DUAL SENSOR (WIRE) ×4 IMPLANT
HOLDER FOLEY CATH W/STRAP (MISCELLANEOUS) ×4 IMPLANT
IV NS 1000ML (IV SOLUTION) ×4
IV NS 1000ML BAXH (IV SOLUTION) ×2 IMPLANT
KIT BASIN OR (CUSTOM PROCEDURE TRAY) ×6 IMPLANT
KIT SUPRAPUBIC CATH (MISCELLANEOUS) IMPLANT
MANIFOLD NEPTUNE II (INSTRUMENTS) ×8 IMPLANT
MATRISTEM PELVIC FLOOR (Tissue) ×2 IMPLANT
NDL MAYO 6 CRC TAPER PT (NEEDLE) ×2 IMPLANT
NEEDLE HYPO 22GX1.5 SAFETY (NEEDLE) ×10 IMPLANT
NEEDLE MAYO 6 CRC TAPER PT (NEEDLE) IMPLANT
NS IRRIG 1000ML POUR BTL (IV SOLUTION) ×8 IMPLANT
PACK CYSTO (CUSTOM PROCEDURE TRAY) ×4 IMPLANT
PACKING VAGINAL (PACKING) ×2 IMPLANT
PENCIL BUTTON HOLSTER BLD 10FT (ELECTRODE) ×4 IMPLANT
PLUG CATH AND CAP STER (CATHETERS) ×4 IMPLANT
POUCH SPECIMEN RETRIEVAL 10MM (ENDOMECHANICALS) ×2 IMPLANT
RETRACTOR LONRSTAR 16.6X16.6CM (MISCELLANEOUS) ×2 IMPLANT
RETRACTOR STAY HOOK 5MM (MISCELLANEOUS) ×4 IMPLANT
RETRACTOR STER APS 16.6X16.6CM (MISCELLANEOUS) ×4
SCRUB PCMX 4 OZ (MISCELLANEOUS) ×2 IMPLANT
SEALER TISSUE G2 CVD JAW 45CM (ENDOMECHANICALS) ×2 IMPLANT
SET IRRIG TUBING LAPAROSCOPIC (IRRIGATION / IRRIGATOR) ×4 IMPLANT
SHEET LAVH (DRAPES) ×4 IMPLANT
SLEEVE XCEL OPT CAN 5 100 (ENDOMECHANICALS) ×2 IMPLANT
SLING LYNX SUPRAPUBIC (Sling) ×2 IMPLANT
SPONGE LAP 4X18 X RAY DECT (DISPOSABLE) ×10 IMPLANT
SUCTION FRAZIER 12FR DISP (SUCTIONS) ×6 IMPLANT
SUT CAPIO ETHIBPND (SUTURE) ×6 IMPLANT
SUT VIC AB 0 CT1 18XCR BRD 8 (SUTURE) IMPLANT
SUT VIC AB 0 CT1 27 (SUTURE)
SUT VIC AB 0 CT1 27XBRD ANTBC (SUTURE) ×4 IMPLANT
SUT VIC AB 0 CT1 36 (SUTURE) ×8 IMPLANT
SUT VIC AB 0 CT1 8-18 (SUTURE) ×8
SUT VIC AB 2-0 CT1 27 (SUTURE) ×8
SUT VIC AB 2-0 CT1 27XBRD (SUTURE) ×4 IMPLANT
SUT VIC AB 2-0 SH 27 (SUTURE) ×4
SUT VIC AB 2-0 SH 27X BRD (SUTURE) ×12 IMPLANT
SUT VIC AB 2-0 UR5 27 (SUTURE) ×2 IMPLANT
SUT VIC AB 2-0 UR6 27 (SUTURE) ×26 IMPLANT
SUT VIC AB 3-0 PS2 18 (SUTURE)
SUT VIC AB 3-0 PS2 18XBRD (SUTURE) ×2 IMPLANT
SUT VIC AB 3-0 SH 27 (SUTURE)
SUT VIC AB 3-0 SH 27XBRD (SUTURE) ×12 IMPLANT
SUT VIC AB 4-0 PS2 27 (SUTURE) ×2 IMPLANT
SUT VICRYL 0 27 CT2 27 ABS (SUTURE) ×4 IMPLANT
SUT VICRYL 0 TIES 12 18 (SUTURE) ×2 IMPLANT
SUT VICRYL 0 UR6 27IN ABS (SUTURE) ×16 IMPLANT
SYRINGE 10CC LL (SYRINGE) ×6 IMPLANT
TOWEL OR 17X26 10 PK STRL BLUE (TOWEL DISPOSABLE) ×6 IMPLANT
TOWEL OR NON WOVEN STRL DISP B (DISPOSABLE) ×4 IMPLANT
TROCAR BALLN 12MMX100 BLUNT (TROCAR) ×4 IMPLANT
TROCAR BLADELESS OPT 5 100 (ENDOMECHANICALS) ×2 IMPLANT
TROCAR BLADELESS OPT 5 75 (ENDOMECHANICALS) ×2 IMPLANT
TROCAR SLEEVE XCEL 5X75 (ENDOMECHANICALS) ×2 IMPLANT
TUBING CONNECTING 10 (TUBING) ×7 IMPLANT
TUBING CONNECTING 10' (TUBING) ×3
TUBING INSUFFLATION 10FT LAP (TUBING) ×2 IMPLANT
WATER STERILE IRR 1500ML POUR (IV SOLUTION) ×4 IMPLANT
YANKAUER SUCT BULB TIP 10FT TU (MISCELLANEOUS) ×4 IMPLANT

## 2014-07-14 NOTE — Anesthesia Procedure Notes (Signed)
Procedure Name: Intubation Date/Time: 07/14/2014 7:57 AM Performed by: Ofilia Neas Pre-anesthesia Checklist: Patient identified, Timeout performed, Emergency Drugs available, Suction available and Patient being monitored Patient Re-evaluated:Patient Re-evaluated prior to inductionOxygen Delivery Method: Circle system utilized Preoxygenation: Pre-oxygenation with 100% oxygen Intubation Type: IV induction and Cricoid Pressure applied Ventilation: Mask ventilation without difficulty Grade View: Grade III Tube type: Glide rite Tube size: 7.5 mm Number of attempts: 1 Airway Equipment and Method: Video-laryngoscopy Placement Confirmation: ETT inserted through vocal cords under direct vision,  positive ETCO2 and breath sounds checked- equal and bilateral Secured at: 21 cm Tube secured with: Tape Dental Injury: Injury to lip  Difficulty Due To: Difficulty was anticipated, Difficult Airway- due to limited oral opening, Difficult Airway- due to anterior larynx, Difficult Airway- due to immobile epiglottis and Difficult Airway- due to reduced neck mobility Future Recommendations: Recommend- induction with short-acting agent, and alternative techniques readily available Comments: Elective glide scope intubation due to past history of requiring glidescope.  Neck is almost  immobile. Tongue and posterior pharynx coated with white.  Cords exposed with cricoid pressure, head lift, mac 3 glidescope blade.  Small nick to left upper lip.

## 2014-07-14 NOTE — Progress Notes (Signed)
Patient ID: Sherry Olsen, female   DOB: 02-14-1933, 80 y.o.   MRN: [id number] Af vss Minimal bleeding abd soft Good uo

## 2014-07-14 NOTE — Op Note (Signed)
NAME:  Sherry Olsen, Sherry Olsen NO.:  [id number]  MEDICAL RECORD NO.:  [id number]  LOCATION:  [facility]                         FACILITY:  [facility]  PHYSICIAN:  Darlyn Chamber, M.D.   DATE OF BIRTH:  1932-11-03  DATE OF PROCEDURE:  07/14/2014 DATE OF DISCHARGE:                              OPERATIVE REPORT   PREOPERATIVE DIAGNOSIS:  Symptomatic pelvic relaxation.  POSTOPERATIVE DIAGNOSIS:  Symptomatic pelvic relaxation.  OPERATIVE PROCEDURE:  Laparoscopic-assisted vaginal hysterectomy with bilateral salpingo-oophorectomy.  SURGEON:  Darlyn Chamber, MD  ASSISTANT:  Ralene Bathe. Matthew Saras, MD  ANESTHESIA:  General endotracheal.  ESTIMATED BLOOD LOSS:  400 mL.  PACKS:  None.  DRAINS:  Urethral Foley.  INTRAOPERATIVE BLOOD PLACEMENT:  None.  COMPLICATIONS:  None.  INDICATIONS:  Dictated in history and physical.  PROCEDURE IN DETAIL:  The patient was taken to the OR and placed in supine position.  After satisfactory level of general endotracheal anesthesia was obtained, the patient was placed in dorsal supine position using the Allen stirrups.  The abdomen, perineum, and vagina were prepped out with Betadine.  Bladder was emptied by in and out catheterization.  She basically had a complete procidentia noted.  A Hulka tenaculum was placed on the cervix and the uterus was placed back up into the pelvic cavity.  The patient was draped in sterile field. Subumbilical incision was made with a knife and carried through subcutaneous tissue.  The fascia was entered with sharp incision and fashioned laterally.  Peritoneum was entered bluntly with finger pressure.  Open laparoscopic trocar was put in place and secured. Abdomen was insufflated with carbon dioxide.  Laparoscope was introduced.  A 5-mm trocar was put in place in suprapubic area under direct visualization.  Uterus was normal size and shape.  Both ovaries were extremely atrophic.  Tubes were unremarkable.  Cul-de-sac  was completely clear.  We elevated the right ovary and tube.  It was well above the pelvic brim.  Using the ligature, the ovarian vessels were cauterized and incised along with the mesenteric attachment of the tube and ovary up to the round ligament.  The right broad ligament was then cauterized and incised.  Next, the left tube and ovary were elevated well below the pelvic brim.  Using the ligature, the left ovarian vessels were cauterized and incised.  The mesenteric attachments of the tube were cauterized and incised up to the round ligament.  The round ligament was cauterized and incised.  The abdomen was desufflated with carbon dioxide.  Laparoscope was removed and we went vaginally at this point in time.  Again,  we had complete procidentia.  The cervix was grabbed with Ardis Hughs tenaculum.  Using the bipolar, we then made an incision around the cervix.  We dissected the bladder superiorly.  Next, paracervical tissue was clamped, cut, and suture ligated with 0 Vicryl. We continued this process dissecting the bladder superiorly. Eventually, we were able to enter the cul-de-sac.  Next, using the clamp cut and tie technique with suture ligature of 0 Vicryl, the parametrium was serially separated and incised.  Uterus and cul-de-sac were entered sharply.  At this point in time, the uterus was flipped, remaining pedicles  were clamped and cut.  Uterus, tubes, and ovaries were passed off the operative field and sent to Pathology, pedicles were secured with free tie of 0 Vicryl.  Posterior vaginal cuff was closed with a running locking suture of 0 Vicryl.  At this point in time, the vaginal mucosa was reapproximated with interrupted figure-of-eights of 2-0 Vicryl.  Foley was placed to straight drain.  We then re-inflated her abdomen with a laparoscope.  She had an arterial bleeder from the left pelvic sidewall where the left ovary was brought under control using the bipolar.  Areas of bleeding  were brought under control with bipolar.  We thoroughly irrigated the pelvis, removed clotted material.  At this point in time, the cul-de-sac was clear.  Vaginal cuff was hemostatically intact, and both sides of the ovarian vasculature were hemostatically intact.  We deflated the abdomen and reinflated again, no bleeding was encountered.  The abdomen was deflated with carbon dioxide. All trocars removed.  Subumbilical fascia closed with 3 figure-of-eights of 0 Vicryl.  Skin was closed with interrupted subcuticulars of 4-0 Vicryl.  Suprapubic incision was closed with Dermabond. Sponge, instrument and needle count was correct by circulating nurse x2.  The patient did tolerate the procedure well.  At this point in time, Dr. Gaynelle Arabian came in to do the vaginal repair work.     Darlyn Chamber, M.D.     JSM/MEDQ  D:  07/14/2014  T:  07/14/2014  Job:  [id number]
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
## 2014-07-14 NOTE — Op Note (Signed)
Pre-operative diagnosis : Stage IV complete pelvic organ prolapse with anterior vault prolapse, stress urinary counts, apical cuff descent, and stress urinary incontinence  Postoperative diagnosis:  Same  Operation: Cystourethroscopy, bilateral retrograde pyelogram with interpretation, bilateral ureteral catheterization; Anterior vault prolapse repair, with Kelly plication, and sacrospinous suture fixation of the apex, with  ACell tissue implantation ; and Lynx pubovaginal sling.  Surgeon:  Chauncey Cruel. Gaynelle Arabian, MD  First assistant:  Operating room assistants  Anesthesia:  [facility]  Preparation/ 2nd Surgeon Procedure:  After the patient underwent laparoscopic bilateral salpingo-oophorectomy and vaginal hysterectomy per Dr.McComb, a 2nd Time-Out was observed, and then  the vaginal vault was reassessed. Cystourethroscopy was accomplished, showing atrophic urethritis, with cystoscopy showing normal appearing bladder, without stone, tumor, or diverticular formation. The ureteral orifices were identified, and bilateral retrograde PolyGram was performed, showing normal appearing ureters. A 6 French open-ended catheters were then placed in the ureters up to the level of the renal pelvis bilaterally, 2 both identified the ureters, and ensure there safety during the procedure.  The stents were then ligated to a 41 French Foley catheter, which is placed in the bladder, with 10 cc of water in the balloon.  50 cc of 0.25 Marcaine, with epinephrine 1 200,000 was injected in an area 1 cm proximal to the urethrovaginal line, and a horizontal fashion. Injection was accomplished back to the sacrospinous ligaments. Is accomplished both Hydro dissection, and postop analgesia. Areas were marked with a marking pen. Mid urethra was marked with a marking pen, and the bladder neck was marked with a marking pen.  A 6 cm horizontal incision is made 1 finger breath proximal to the urethrovaginal junction, and subcutaneous tissue  was locally and sharply dissected. Sharp dissection was carried into the pelvic floor, where the ischial spines were identified bilaterally. The sacrospinous ligaments were dissected bilaterally. Using the [facility], In device, 2 permanent sutures were placed through the sacrospinous ligaments, one finger breath medial to the ischial spines bilaterally. Following this, Kelly plication was accomplished in the midline. Measurement was then accomplished of the incision, and a piece of ACell tissue was soaked for 15 minutes, and cut to measure. Measurements were taken both vertically and horizontally, and measured 4.5 cm horizontally, and 5 cementer vertically. The ACell elvic Floor Matrix tissue was used for this procedure. The sacrospinous ligament sutures were then placed through the 80 cell tissue, and the tissue was sutured proximally to the sacrospinous ligaments. Following this, the tissue was sutured to the pelvic floor without wrinkles. The vaginal edges were freshened, and closure is accomplished by running 2-0 Vicryl suture from 12:00 to 4:00, and from 12:00 to 8:00 bilaterally. No bleeding was noted.  Attention was then directed to the midline of the urethra, for the [facility] pubovaginal sling. The midline was marked with marking pen, and an area 2 fingerbreadths proximal to the pubic tubercle and midline and lateral to the pubic tubercle were is marked with a marking pen. Incisions were made with a 15 blade, after injection with a fresh needle of Marcaine and epinephrine. Marcaine and epinephrine was also used to dissect the periurethral tissue. Following incision in the mid urethral area, tissue was dissected to the level of the pubis. 2 Geologist, engineering were then placed retropubically, and the [place] mesh was placed in standard fashion retropubically, with the right angle clamp in the midline for tensioning. The clamp was left in the open position. The plastic button was removed from the midline, and the  plastic sleeves removed in  standard fashion. The sleeves were then cut under the skin edge bilaterally. The graft material sat smooth against the urethra without tension.  The wound was closed running 2-0 Vicryl suture. Cystoscopy was accomplished with the needles were in position, and showed no evidence of needles within the bladder. The ureteral catheters were removed and copious urine was noted from the ureters. There was no bleeding the bladder.  With the Foley in place, and wounds are closed, with proper sponge and needle count, the vaginal packing was placed with estrogen cream. The patient received IV Tylenol. She was awakened and taken to recovery room in good condition.  Review history:   Active Problems Problems  1. Complete Vaginal Cuff Prolapse 2. Pelvic relaxation due to uterine prolapse (618.1)   Assessed By: Carolan Clines (Urology); Last Assessed: 02 Jun 2014 3. Pelvic relaxation due to uterovaginal prolapse, complete (618.3)   Assessed By: Carolan Clines (Urology); Last Assessed: 02 Jun 2014 4. Urinary frequency (788.41) 5. Urinary incontinence with continuous leakage (788.37) 6. Urinary incontinence without sensory awareness (788.34)   History of Present Illness     80 year old female returns today to review urodynamic results. Originally referred by Dr. Radene Knee for evaluation of pelvic floor prolapse and urinary incontinence. The patient has worsening cystocele, with uterine descent over the last 10 years. 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 She has had attempts at pessary placement, but they will not stay in place. She is para 6-5-1.       Review of systems is significant for urinary frequency, nocturia, hesitancy, difficulty initiating her urinary stream, and urinary straining. On physical examination, the patient has urethral hypermobility, poorly estrogenized vagina, uterine prolapse, and stage IV midline cystocele. No  enterocele was identified. Midline stage II rectocele is identified.       Urodynamics on 05/20/14, shows a strong desire to void at 106 cc. There is a low amplitude unstable bladder contraction of 7 cm of water, with no leakage.       Leak point pressure: The cough leak point pressure at 100 cc is 69 cm water, with mild urinary leakage. The Valsalva leak point pressure at 200 cc is 34 with mild to moderate leakage.       Pressure flow study: The patient voided a maximum volume of 227 cc with a flow rate of 10 cc/s. Detrusor pressure at maximum flow is 5 cm H20, with maximum detrusor pressure of 67 cm water. PVR is 140 cc. The contraction is weak, and not well sustained. The patient voided with vaginal packing in place.       Mrs. Rothery has a maximum capacity of 385 cc. She has stress urinary incontinence with her prolapse reduced. There was no incontinence prior to the reduction of her prolapse. She has a weak bladder contraction, which is not well sustained. She did not empty well, leaving a post void residual of 140 cc. No reflux was identified by fluoroscopy.        ics  Hives   .  Tetracyclines & Related  Hives and Other (See Comments)       Minocycline - could hardly talk      History reviewed. No pertinent family history. Social History: reports that she has never smoked. She has never used smokeless tobacco. She reports that she drinks about .6 ounces of alcohol per week. She reports that she does not use illicit drugs.   ROS: All systems are reviewed and negative except as noted.  Statement of  Likelihood of Success: Excellent. TIME-OUT observed.

## 2014-07-14 NOTE — Brief Op Note (Signed)
07/14/2014  9:35 AM  PATIENT:  Minerva Ends  80 y.o. female  PRE-OPERATIVE DIAGNOSIS:  PELVIC RELAXATION   POST-OPERATIVE DIAGNOSIS:  * No post-op diagnosis entered *  PROCEDURE:  Procedure(s): LAPAROSCOPIC ASSISTED VAGINAL HYSTERECTOMY WITH BILATERAL SALPINGO OOHORECTOMY   (Bilateral) CYSTOSCOPY WITH BILATERAL RETROGRADE PYELOGRAM, AND BILATERAL URETERAL STENTS (Bilateral)  ANTERIOR VAGINAL VAULT PROLAPSE REPAIR WITH KELLY PLICATION SACRAL SPINOUS FIXATION WITH A CELL,  RECTOCELE REPAIR WITH A CELL,   (N/A)  LYNX SLING  (N/A)  SURGEON:  Surgeon(s) and Role: Panel 1:    * Margarette Asal, MD - Assisting    * Darlyn Chamber, MD - Primary  Panel 2:    * Ailene Rud, MD - Primary  PHYSICIAN ASSISTANT:   ASSISTANTS: holland   ANESTHESIA:   local and general  EBL:     BLOOD ADMINISTERED:none  DRAINS: Urinary Catheter (Foley)   LOCAL MEDICATIONS USED:  MARCAINE     SPECIMEN:  Source of Specimen:  uterus tubes and ovaries  DISPOSITION OF SPECIMEN:  PATHOLOGY  COUNTS:  YES  TOURNIQUET:  * No tourniquets in log *  DICTATION: .Other Dictation: Dictation Number [id number]  PLAN OF CARE: Admit for overnight observation  PATIENT DISPOSITION:  PACU - hemodynamically stable.   Delay start of Pharmacological VTE agent (>24hrs) due to surgical blood loss or risk of bleeding: no
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
## 2014-07-14 NOTE — Transfer of Care (Signed)
Immediate Anesthesia Transfer of Care Note  Patient: Sherry Olsen  Procedure(s) Performed: Procedure(s): LAPAROSCOPIC ASSISTED VAGINAL HYSTERECTOMY WITH BILATERAL SALPINGO OOHORECTOMY   (Bilateral) CYSTOSCOPY WITH BILATERAL RETROGRADE PYELOGRAM, AND BILATERAL URETERAL STENTS (Bilateral)  ANTERIOR VAGINAL VAULT PROLAPSE REPAIR WITH KELLY PLICATION SACRAL SPINOUS FIXATION WITH A CELL,  RECTOCELE REPAIR WITH A CELL,   (N/A)  LYNX SLING  (N/A)  Patient Location: PACU  Anesthesia Type:General  Level of Consciousness: awake, alert , oriented and patient cooperative  Airway & Oxygen Therapy: Patient Spontanous Breathing and Patient connected to face mask oxygen  Post-op Assessment: Report given to PACU RN, Post -op Vital signs reviewed and stable and Patient moving all extremities  Post vital signs: Reviewed and stable  Complications: No apparent anesthesia complications

## 2014-07-14 NOTE — Anesthesia Postprocedure Evaluation (Signed)
  Anesthesia Post-op Note  Patient: Sherry Olsen  Procedure(s) Performed: Procedure(s) (LRB): LAPAROSCOPIC ASSISTED VAGINAL HYSTERECTOMY WITH BILATERAL SALPINGO OOHORECTOMY   (Bilateral) CYSTOSCOPY WITH BILATERAL RETROGRADE PYELOGRAM, AND BILATERAL URETERAL STENTS (Bilateral)  ANTERIOR VAGINAL VAULT PROLAPSE REPAIR WITH KELLY PLICATION SACRAL SPINOUS FIXATION WITH A CELL,  RECTOCELE REPAIR WITH A CELL,   (N/A)  LYNX SLING  (N/A)  Patient Location: PACU  Anesthesia Type: General  Level of Consciousness: awake and alert   Airway and Oxygen Therapy: Patient Spontanous Breathing  Post-op Pain: mild  Post-op Assessment: Post-op Vital signs reviewed, Patient's Cardiovascular Status Stable, Respiratory Function Stable, Patent Airway and No signs of Nausea or vomiting  Last Vitals:  Filed Vitals:   07/14/14 1350  BP: 145/64  Pulse: 89  Temp: 36.3 C  Resp: 15    Post-op Vital Signs: stable   Complications: No apparent anesthesia complications

## 2014-07-14 NOTE — Op Note (Signed)
Patient name  Sherry Olsen, Sherry Olsen DICTATION# [id number] CSN# [id number]  [facility], MD 07/14/2014 9:36 AM

## 2014-07-15 ENCOUNTER — Encounter (HOSPITAL_COMMUNITY): Payer: Self-pay | Admitting: Obstetrics and Gynecology

## 2014-07-15 ENCOUNTER — Observation Stay (HOSPITAL_COMMUNITY): Payer: Medicare Other

## 2014-07-15 LAB — HEMOGLOBIN AND HEMATOCRIT, BLOOD
HEMATOCRIT: 36.6 % (ref 36.0–46.0)
HEMOGLOBIN: 12.3 g/dL (ref 12.0–15.0)

## 2014-07-15 MED ORDER — TAMSULOSIN HCL 0.4 MG PO CAPS
0.4000 mg | ORAL_CAPSULE | Freq: Every day | ORAL | Status: DC
  Administered 2014-07-15 – 2014-07-18 (×4): 0.4 mg via ORAL
  Filled 2014-07-15 (×4): qty 1

## 2014-07-15 NOTE — Progress Notes (Signed)
Packing and foley catheter removed this am per order.

## 2014-07-15 NOTE — Progress Notes (Signed)
Patient with episode of vomiting x1 of clear yellow emesis with some undigested food. After vomiting patient reports decrease in abdominal pressure and distention and relief of pain and discomfort. PRN Zofran given for nausea.
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
## 2014-07-15 NOTE — Progress Notes (Signed)
Patient started complaining of severe pain with distended abdomen later this afternoon.  No relief from pain medication, bladder scan- 126mls. Dr. Gaynelle Arabian is aware and patient is having a 3-way stat abdominal series at bedside now.

## 2014-07-15 NOTE — Progress Notes (Addendum)
Call placed to Dr. Gaynelle Arabian to inform him of abdominal xray results. New orders for increase IV fluids to 34mL/hr and change diet to ice chips only. Patient informed of change in orders. Dr. Gaynelle Arabian will come to floor to see patient.

## 2014-07-15 NOTE — Progress Notes (Signed)
Urology Progress Note  1 Day Post-Op   Subjective: 1. Can' lift leg".    O: pt seen by PT . Dx femoral nerve palsy. Examined tonight. Sensation has returned to inner thigh, but she cannpt lift leg yet. Able to move leg in all other directions. Can walk with assistance. A: Femoral nerve palsy, resolving.  P: follow: may need PT in follow-up #2. Acute post-op ileus   S: pt developed sudden distended abdomen tonight, with nausea and vomiting   O: Exam: Abd soft now. Quiet: no bowel sounds. KUB: c/w ileus   A: Post op lap hysterectomy/BSO/Anterior vault repair and sling with post op ileus   P: change diet to ice chips and increase IV to 1/2 NS at 75cc/hr.  #3. Hx of dizziness wit surgeries ( after orthopedic and urogynecology surgery     O: Before Flomax: BP 125/42, p: 86 lying                                    BP 136/53 p: 87 sitting                                    BP  124/44.  P: 76  Standing     A: not orthostatic, but note wide BP range from systolic to dyalstolic. Pulse never changes, however. Not clinically symptomatic. Pt has voided much better since flomax, with out any dizziness.    P: ck orthostatics after flomax.        Ck pvr in AM.      No acute urologic events overnight. Ambulation:   positive Flatus:    negative Bowel movement  negative  Pain: some relief  Objective:  Blood pressure 129/45, pulse 62, temperature 98.1 F (36.7 C), temperature source Oral, resp. rate 18, height 5\' 1"  (1.549 m), weight 62.3 kg (137 lb 5.6 oz), SpO2 92.00%.  Physical Exam:  General:  No acute distress, awake Resp: clear to auscultation bilaterally Genitourinary:   Slight vaginal drainage ( serosanguinous) Foley:  Out. Voiding spontaneously.     I/O last 3 completed shifts: In: 3693.3 [I.V.:3693.3] Out: 4885 [Urine:4885]  Recent Labs     07/14/14  1416  07/15/14  0407  HGB  12.7  12.3  WBC  12.6*   --   PLT  250   --     Recent Labs     07/14/14  0733  07/14/14  1416   NA  134*   --   K  4.8   --   CREATININE   --   0.52  GFRNONAA   --   89*  GFRAA   --   >90     No results found for this basename: PT, INR, APTT,  in the last 72 hours   No components found with this basename: ABG,   Assessment/Plan: Stable on exam tonight  P: 1. Ck post Flomax orthostatics 2. Treat post op ileus conservatively 3. Ck post void residual in AM.  4. May advance diet in AM if BS return 5. Continue to observe resolving Femoral nerve palsy.

## 2014-07-15 NOTE — Evaluation (Signed)
Physical Therapy One Time Evaluation Patient Details Name: Sherry Olsen MRN: [id number] DOB: [birth year] Today's Date: 07/15/2014   History of Present Illness  Pt is a 80 year old female s/p surgery involving: Cystourethroscopy, bilateral retrograde pyelogram with interpretation, bilateral ureteral catheterization; Anterior vault prolapse repair, with Kelly plication, and sacrospinous suture fixation of the apex, with  ACell tissue implantation ; and Lynx pubovaginal sling.  Physical therapy was ordered to assess pt's inability to life L LE post op ?nerve or muscle involvement.   Clinical Impression  Patient evaluated by Physical Therapy with no further acute PT needs identified. All education has been completed and the patient has no further questions. Pt with no light touch sensation to L1 dermatome distribution and unable to perform hip flexion.  Quadriceps muscle strength good however hip flexors (iliopsoas) likely compromised from L1.  Uncertain cause of L1 nerve involvement/damage, possibly due to prolonged stretch or pressure on nerve during surgery (such as positioning).  Discussed findings with pt and Dr. Gaynelle Arabian with recommendations for pt to rest leg and no use of ice/heat over L hip/groin area (due to no sensory input).  If pt still with sensory deficit and having difficulty with hip flexion in about a week, would recommend f/u outpatient PT.  PT is signing off. Thank you for this referral.     Follow Up Recommendations Outpatient PT (if sensation and strength do not return, recommend f/u oupatient PT)    Equipment Recommendations  None recommended by PT    Recommendations for Other Services       Precautions / Restrictions Precautions Precautions: Fall      Mobility  Bed Mobility Overal bed mobility: Needs Assistance Bed Mobility: Supine to Sit;Sit to Supine     Supine to sit: Min assist Sit to supine: Min assist   General bed mobility comments: required assist for  lifting L LE  Transfers Overall transfer level: Needs assistance Equipment used: 1 person hand held assist Transfers: Sit to/from Stand Sit to Stand: Supervision         General transfer comment: supervision for safety  Ambulation/Gait Ambulation/Gait assistance: Supervision Ambulation Distance (Feet): 10 Feet Assistive device:  (held IV pole) Gait Pattern/deviations: Step-through pattern;Decreased step length - right;Antalgic     General Gait Details: decreased L hip flexion however pt able to ambulate to bathroom using IV pole, pt reports she has access to RW if needed at home and spouse can assist her  Stairs            Wheelchair Mobility    Modified Rankin (Stroke Patients Only)       Balance                                             Pertinent Vitals/Pain Pain Assessment: 0-10 Pain Location: not rated abdominal pain Pain Descriptors / Indicators: Aching;Sore;Tender Pain Intervention(s): Monitored during session    [place] expects to be discharged to:: Private residence Living Arrangements: Spouse/significant other               Additional Comments: pt reports she has access to RW if needed    Prior Function Level of Independence: Independent               Hand Dominance        Extremity/Trunk Assessment  Lower Extremity Assessment: LLE deficits/detail   LLE Deficits / Details: hip abduction, adduction, IR, ER intact, quadriceps muscles at least 4/5, attempted to palpate iliopsoas and have pt perform hip flexion however difficult reaching due to activation of abdominal musculature, unable to perform SLR, sensation intact except over L1 dermatome as pt had no light touch but able to feel deep pressure  Cervical / Trunk Assessment: Other exceptions  Communication   Communication: No difficulties  Cognition Arousal/Alertness: Awake/alert Behavior During Therapy: WFL for tasks  assessed/performed Overall Cognitive Status: Within Functional Limits for tasks assessed                      General Comments      Exercises        Assessment/Plan    PT Assessment All further PT needs can be met in the next venue of care  PT Diagnosis Difficulty walking (compromised L1 nerve)   PT Problem List Decreased strength;Impaired sensation;Other (comment) (possible L1 nerve compromised)  PT Treatment Interventions     PT Goals (Current goals can be found in the Care Plan section) Acute Rehab PT Goals PT Goal Formulation: No goals set, d/c therapy    Frequency     Barriers to discharge        Co-evaluation               End of Session   Activity Tolerance: Patient tolerated treatment well Patient left:  (in bathroom, to call for RN when finished)      Functional Assessment Tool Used: clinical judgement, decreased sensation L1 dermatome distribution, decreased hip flexor strength Functional Limitation: Other PT primary Other PT Primary Current Status (T1572): At least 1 percent but less than 20 percent impaired, limited or restricted Other PT Primary Goal Status (I2035): At least 1 percent but less than 20 percent impaired, limited or restricted Other PT Primary Discharge Status [id number]): At least 1 percent but less than 20 percent impaired, limited or restricted    Time: 1128-1149 PT Time Calculation (min): 21 min   Charges:   PT Evaluation $Initial PT Evaluation Tier I: 1 Procedure PT Treatments $Self Care/Home Management: 8-22   PT G Codes:   Functional Assessment Tool Used: clinical judgement, decreased sensation L1 dermatome distribution, decreased hip flexor strength Functional Limitation: Other PT primary    Ardell Makarewicz,KATHrine E 07/15/2014, 2:08 PM Carmelia Bake, PT, DPT 07/15/2014 Pager: [phone]

## 2014-07-15 NOTE — Progress Notes (Signed)
1 Day Post-Op Procedure(s) (LRB): LAPAROSCOPIC ASSISTED VAGINAL HYSTERECTOMY WITH BILATERAL SALPINGO OOHORECTOMY   (Bilateral) CYSTOSCOPY WITH BILATERAL RETROGRADE PYELOGRAM, AND BILATERAL URETERAL STENTS (Bilateral)  ANTERIOR VAGINAL VAULT PROLAPSE REPAIR WITH KELLY PLICATION SACRAL SPINOUS FIXATION WITH A CELL,  RECTOCELE REPAIR WITH A CELL,   (N/A)  LYNX SLING  (N/A)  Subjective: Patient reports tolerating PO and + flatus.    Objective: I have reviewed patient's vital signs and labs.  General: alert GI: soft, non-tender; bowel sounds normal; no masses,  no organomegaly Vaginal Bleeding: minimal  Assessment: s/p Procedure(s): LAPAROSCOPIC ASSISTED VAGINAL HYSTERECTOMY WITH BILATERAL SALPINGO OOHORECTOMY   (Bilateral) CYSTOSCOPY WITH BILATERAL RETROGRADE PYELOGRAM, AND BILATERAL URETERAL STENTS (Bilateral)  ANTERIOR VAGINAL VAULT PROLAPSE REPAIR WITH KELLY PLICATION SACRAL SPINOUS FIXATION WITH A CELL,  RECTOCELE REPAIR WITH A CELL,   (N/A)  LYNX SLING  (N/A): stable  Plan: Advance diet  LOS: 1 day    Sherry Olsen S 07/15/2014, 9:15 AM

## 2014-07-15 NOTE — Progress Notes (Signed)
Dr. Gaynelle Arabian in to see patient, updated on patient symptoms, discussed plan of care with patient. Informed him of patient urine is bright bloody this evening and PVR 213 mL. No new orders at this time.

## 2014-07-15 NOTE — Progress Notes (Signed)
UR completed 

## 2014-07-16 ENCOUNTER — Ambulatory Visit (HOSPITAL_COMMUNITY): Payer: Medicare Other

## 2014-07-16 ENCOUNTER — Encounter (HOSPITAL_COMMUNITY): Payer: Self-pay | Admitting: Radiology

## 2014-07-16 DIAGNOSIS — N393 Stress incontinence (female) (male): Secondary | ICD-10-CM | POA: Diagnosis present

## 2014-07-16 DIAGNOSIS — K9189 Other postprocedural complications and disorders of digestive system: Secondary | ICD-10-CM | POA: Diagnosis not present

## 2014-07-16 DIAGNOSIS — Y836 Removal of other organ (partial) (total) as the cause of abnormal reaction of the patient, or of later complication, without mention of misadventure at the time of the procedure: Secondary | ICD-10-CM | POA: Diagnosis not present

## 2014-07-16 DIAGNOSIS — Z9049 Acquired absence of other specified parts of digestive tract: Secondary | ICD-10-CM | POA: Diagnosis present

## 2014-07-16 DIAGNOSIS — G629 Polyneuropathy, unspecified: Secondary | ICD-10-CM | POA: Diagnosis present

## 2014-07-16 DIAGNOSIS — Z79899 Other long term (current) drug therapy: Secondary | ICD-10-CM | POA: Diagnosis not present

## 2014-07-16 DIAGNOSIS — Y839 Surgical procedure, unspecified as the cause of abnormal reaction of the patient, or of later complication, without mention of misadventure at the time of the procedure: Secondary | ICD-10-CM | POA: Diagnosis not present

## 2014-07-16 DIAGNOSIS — Z882 Allergy status to sulfonamides status: Secondary | ICD-10-CM | POA: Diagnosis not present

## 2014-07-16 DIAGNOSIS — Z881 Allergy status to other antibiotic agents status: Secondary | ICD-10-CM | POA: Diagnosis not present

## 2014-07-16 DIAGNOSIS — Z800 Family history of malignant neoplasm of digestive organs: Secondary | ICD-10-CM | POA: Diagnosis not present

## 2014-07-16 DIAGNOSIS — Z8672 Personal history of thrombophlebitis: Secondary | ICD-10-CM | POA: Diagnosis not present

## 2014-07-16 DIAGNOSIS — Z981 Arthrodesis status: Secondary | ICD-10-CM | POA: Diagnosis not present

## 2014-07-16 DIAGNOSIS — Z888 Allergy status to other drugs, medicaments and biological substances status: Secondary | ICD-10-CM | POA: Diagnosis not present

## 2014-07-16 DIAGNOSIS — H3530 Unspecified macular degeneration: Secondary | ICD-10-CM | POA: Diagnosis present

## 2014-07-16 DIAGNOSIS — K589 Irritable bowel syndrome without diarrhea: Secondary | ICD-10-CM | POA: Diagnosis present

## 2014-07-16 DIAGNOSIS — Z85850 Personal history of malignant neoplasm of thyroid: Secondary | ICD-10-CM | POA: Diagnosis not present

## 2014-07-16 DIAGNOSIS — Z7982 Long term (current) use of aspirin: Secondary | ICD-10-CM | POA: Diagnosis not present

## 2014-07-16 DIAGNOSIS — Z880 Allergy status to penicillin: Secondary | ICD-10-CM | POA: Diagnosis not present

## 2014-07-16 DIAGNOSIS — M810 Age-related osteoporosis without current pathological fracture: Secondary | ICD-10-CM | POA: Diagnosis present

## 2014-07-16 DIAGNOSIS — M1990 Unspecified osteoarthritis, unspecified site: Secondary | ICD-10-CM | POA: Diagnosis present

## 2014-07-16 DIAGNOSIS — R3911 Hesitancy of micturition: Secondary | ICD-10-CM | POA: Diagnosis present

## 2014-07-16 DIAGNOSIS — R3916 Straining to void: Secondary | ICD-10-CM | POA: Diagnosis present

## 2014-07-16 DIAGNOSIS — R350 Frequency of micturition: Secondary | ICD-10-CM | POA: Diagnosis present

## 2014-07-16 DIAGNOSIS — N3641 Hypermobility of urethra: Secondary | ICD-10-CM | POA: Diagnosis present

## 2014-07-16 DIAGNOSIS — N819 Female genital prolapse, unspecified: Secondary | ICD-10-CM | POA: Diagnosis present

## 2014-07-16 DIAGNOSIS — N816 Rectocele: Secondary | ICD-10-CM | POA: Diagnosis present

## 2014-07-16 DIAGNOSIS — N8189 Other female genital prolapse: Secondary | ICD-10-CM | POA: Diagnosis present

## 2014-07-16 DIAGNOSIS — G839 Paralytic syndrome, unspecified: Secondary | ICD-10-CM | POA: Diagnosis not present

## 2014-07-16 DIAGNOSIS — I10 Essential (primary) hypertension: Secondary | ICD-10-CM | POA: Diagnosis present

## 2014-07-16 DIAGNOSIS — N813 Complete uterovaginal prolapse: Secondary | ICD-10-CM | POA: Diagnosis present

## 2014-07-16 DIAGNOSIS — E785 Hyperlipidemia, unspecified: Secondary | ICD-10-CM | POA: Diagnosis present

## 2014-07-16 DIAGNOSIS — N3942 Incontinence without sensory awareness: Secondary | ICD-10-CM | POA: Diagnosis present

## 2014-07-16 DIAGNOSIS — K219 Gastro-esophageal reflux disease without esophagitis: Secondary | ICD-10-CM | POA: Diagnosis present

## 2014-07-16 LAB — DIFFERENTIAL
BASOS PCT: 0 % (ref 0–1)
Basophils Absolute: 0 10*3/uL (ref 0.0–0.1)
EOS PCT: 0 % (ref 0–5)
Eosinophils Absolute: 0 10*3/uL (ref 0.0–0.7)
Lymphocytes Relative: 6 % — ABNORMAL LOW (ref 12–46)
Lymphs Abs: 0.8 10*3/uL (ref 0.7–4.0)
Monocytes Absolute: 1.4 10*3/uL — ABNORMAL HIGH (ref 0.1–1.0)
Monocytes Relative: 9 % (ref 3–12)
NEUTROS PCT: 85 % — AB (ref 43–77)
Neutro Abs: 12.5 10*3/uL — ABNORMAL HIGH (ref 1.7–7.7)

## 2014-07-16 LAB — BASIC METABOLIC PANEL
Anion gap: 14 (ref 5–15)
BUN: 9 mg/dL (ref 6–23)
CALCIUM: 8.7 mg/dL (ref 8.4–10.5)
CO2: 25 mEq/L (ref 19–32)
CREATININE: 0.83 mg/dL (ref 0.50–1.10)
Chloride: 89 mEq/L — ABNORMAL LOW (ref 96–112)
GFR calc Af Amer: 76 mL/min — ABNORMAL LOW (ref >90)
GFR, EST NON AFRICAN AMERICAN: 65 mL/min — AB (ref >90)
GLUCOSE: 93 mg/dL (ref 70–99)
Potassium: 3.8 mEq/L (ref 3.7–5.3)
SODIUM: 128 meq/L — AB (ref 137–147)

## 2014-07-16 LAB — CBC
HCT: 35.7 % — ABNORMAL LOW (ref 36.0–46.0)
HEMOGLOBIN: 12 g/dL (ref 12.0–15.0)
MCH: 30 pg (ref 26.0–34.0)
MCHC: 33.6 g/dL (ref 30.0–36.0)
MCV: 89.3 fL (ref 78.0–100.0)
Platelets: 252 10*3/uL (ref 150–400)
RBC: 4 MIL/uL (ref 3.87–5.11)
RDW: 13.6 % (ref 11.5–15.5)
WBC: 14.7 10*3/uL — ABNORMAL HIGH (ref 4.0–10.5)

## 2014-07-16 MED ORDER — IOHEXOL 300 MG/ML  SOLN
100.0000 mL | Freq: Once | INTRAMUSCULAR | Status: AC | PRN
  Administered 2014-07-16: 100 mL via INTRAVENOUS

## 2014-07-16 MED ORDER — IOHEXOL 300 MG/ML  SOLN: 25.0000 mL | INTRAMUSCULAR | Status: DC

## 2014-07-16 MED ORDER — PROMETHAZINE HCL 25 MG/ML IJ SOLN
12.5000 mg | Freq: Four times a day (QID) | INTRAMUSCULAR | Status: DC | PRN
Start: 2014-07-16 — End: 2014-07-18
  Administered 2014-07-16 – 2014-07-17 (×2): 12.5 mg via INTRAVENOUS
  Filled 2014-07-16 (×2): qty 1

## 2014-07-16 MED ORDER — BISACODYL 10 MG RE SUPP: 10.0000 mg | Freq: Every day | RECTAL | Status: DC | PRN

## 2014-07-16 MED ORDER — ONDANSETRON 8 MG/NS 50 ML IVPB
8.0000 mg | Freq: Four times a day (QID) | INTRAVENOUS | Status: DC | PRN
  Filled 2014-07-16: qty 8

## 2014-07-16 NOTE — Progress Notes (Signed)
Patient complaining of abdominal pressure after voiding 200 mL, PVR done to verify bladder emptying was 28 mL. Patient also with episode of dry heaving at this time, PRN Zofran given at 0338. Patient refuses anything for pain at this time. BP 158/60 HR 74. Will continue to monitor.

## 2014-07-16 NOTE — Progress Notes (Signed)
Patient ambulated around unit X 3 >300 feet with minimal assistance. Patient feels that her abdomen feel better, but is not passing gas at this time. Dulcolax suppository given earlier as ordered. Will continue to closely monitor patient.Setzer, Marchelle Folks

## 2014-07-16 NOTE — Progress Notes (Signed)
Urology Progress Note  2 Days Post-Op  Wed: pm note:  1. Ileus: duilcolax today/walking. Now has return of BS hypoactive). Abd: soft. Pt burping. No flatus.                                   A: resolving ileus.                                   P: consider dulcolax tomorrow again. Walk.                             2. Femoral neuropathy: pt has resolved sensory defivitt. Walking without problem. Leg lift: "much improved"                                A: resolving Femoral neuropathy.                                 P" follow.  Subjective:     No acute urologic events overnight. Ambulation:   positive Flatus:    negative Bowel movement  negative  Pain: complete resolution  Objective:  Blood pressure 126/51, pulse 84, temperature 99.5 F (37.5 C), temperature source Oral, resp. rate 20, height 5\' 1"  (1.549 m), weight 62.3 kg (137 lb 5.6 oz), SpO2 98.00%.  Physical Exam:  General:  No acute distress, awake Extremities: Homans sign is negative, no sign of DVT Genitourinary:  Normal BUS. Normal Abd. Soft. + BS, distant.  Foley:  Out voiding well.     I/O last 3 completed shifts: In: 2047.5 [I.V.:2047.5] Out: 1478 [GNFAO:1308]  Recent Labs     07/14/14  1416  07/15/14  0407  07/16/14  0840  HGB  12.7  12.3  12.0  WBC  12.6*   --   14.7*  PLT  250   --   252    Recent Labs     07/14/14  0733  07/14/14  1416  07/16/14  0840  NA  134*   --   128*  K  4.8   --   3.8  CL   --    --   89*  CO2   --    --   25  BUN   --    --   9  CREATININE   --   0.52  0.83  CALCIUM   --    --   8.7  GFRNONAA   --   89*  65*  GFRAA   --   >90  76*     No results found for this basename: PT, INR, APTT,  in the last 72 hours   No components found with this basename: ABG,   Assessment/Plan: labs stable today. CT evaluated. Abdomen is resolving.   Continue any current medications.Walk. Suppository in AM.  Re-ck in AM.

## 2014-07-16 NOTE — Progress Notes (Signed)
Call placed to Dr. Gaynelle Arabian to inform him that patient is complaining of increased abdominal pain and pressure this morning, informed him patient has just vomited small amount of thick green emesis. Abdomen remains distended but is soft. Vital signs as charted. Informed him that patient reports of pain that is more intense than prior this shift and more pressure in the abdomen. States pain is unrelieved by IV dilaudid and refuses additional pain medication at this time.Telephone orders with read back obtained for: STAT CBC and BMP, phenergan 12.5 mg IV Q6H for nausea/vomiting not relieved by zofran, Zofran 8mg  IV Q6H PRN for nausea. He will come to floor to see patient after 0800. Will continue to monitor and call with any additional concerns or changes.

## 2014-07-16 NOTE — Progress Notes (Signed)
Urology Progress Note  2 Days Post-Op : PO ileus with nausea and vomiting.   Subjective:     No acute urologic events overnight. Ambulation:   negative Flatus:    negative Bowel movement  negative  Pain: no relief  Objective:  Blood pressure 158/60, pulse 74, temperature 98.7 F (37.1 C), temperature source Oral, resp. rate 18, height _0  (1.549 m), weight 62.3 kg (137 lb 5.6 oz), SpO2 98.00%.  Physical Exam:  General:  No acute distress, awake Abd: distended, tender to palpation. General tenderness.  Genitourinary:   Normal BUS Foley:no    I/O last 3 completed shifts: In: 2047.5 [I.V.:2047.5] Out: 5424 [ODGSP:9265]  Recent Labs     07/14/14  1416  07/15/14  0407  HGB  12.7  12.3  WBC  12.6*   --   PLT  250   --     Recent Labs     07/14/14  0733  07/14/14  1416  NA  134*   --   K  4.8   --   CREATININE   --   0.52  GFRNONAA   --   89*  GFRAA   --   >90     No results found for this basename: PT, INR, APTT,  in the last 72 hours   No components found with this basename: ABG,   Assessment/Plan:  Assesment:  Chronic constipation, now post op with ileus. Her abdominal exam has changed this AM. She has distended abdomen, with general tenderness. No point tenderness. She will have CT/IV contrast this AM , and will ck her AM HGB, wbc. Concern would be for pelvic bleeding, etc. If negative, may proceed with GI consult/? Enemas.   CT IV contrast ( discussed with Radiology) Await CBC/B Met from this AM

## 2014-07-16 NOTE — Progress Notes (Signed)
Patient ambulated in hallway >200 feet. Patient tolerated fairly well with moderate assistance. Will continue to monitor patient.Setzer, Marchelle Folks

## 2014-07-16 NOTE — Progress Notes (Signed)
2 Days Post-Op Procedure(s) (LRB): LAPAROSCOPIC ASSISTED VAGINAL HYSTERECTOMY WITH BILATERAL SALPINGO OOHORECTOMY   (Bilateral) CYSTOSCOPY WITH BILATERAL RETROGRADE PYELOGRAM, AND BILATERAL URETERAL STENTS (Bilateral)  ANTERIOR VAGINAL VAULT PROLAPSE REPAIR WITH KELLY PLICATION SACRAL SPINOUS FIXATION WITH A CELL,  RECTOCELE REPAIR WITH A CELL,   (N/A)  LYNX SLING  (N/A)  Subjective: Patient reports nausea and vomiting.    Objective: I have reviewed patient's vital signs, labs and radiology results.  General: moderate distress GI: distended post bowel sounds Vaginal Bleeding: none  Assessment: s/p Procedure(s): LAPAROSCOPIC ASSISTED VAGINAL HYSTERECTOMY WITH BILATERAL SALPINGO OOHORECTOMY   (Bilateral) CYSTOSCOPY WITH BILATERAL RETROGRADE PYELOGRAM, AND BILATERAL URETERAL STENTS (Bilateral)  ANTERIOR VAGINAL VAULT PROLAPSE REPAIR WITH KELLY PLICATION SACRAL SPINOUS FIXATION WITH A CELL,  RECTOCELE REPAIR WITH A CELL,   (N/A)  LYNX SLING  (N/A): ileus present  Plan: Encourage ambulation  LOS: 2 days    Shonette Rhames S 07/16/2014, 9:41 AM

## 2014-07-17 MED ORDER — FLEET ENEMA 7-19 GM/118ML RE ENEM
1.0000 | ENEMA | Freq: Every day | RECTAL | Status: DC | PRN
  Administered 2014-07-17: 1 via RECTAL
  Filled 2014-07-17: qty 1

## 2014-07-17 NOTE — Progress Notes (Signed)
3 Days Post-Op Procedure(s) (LRB): LAPAROSCOPIC ASSISTED VAGINAL HYSTERECTOMY WITH BILATERAL SALPINGO OOHORECTOMY   (Bilateral) CYSTOSCOPY WITH BILATERAL RETROGRADE PYELOGRAM, AND BILATERAL URETERAL STENTS (Bilateral)  ANTERIOR VAGINAL VAULT PROLAPSE REPAIR WITH KELLY PLICATION SACRAL SPINOUS FIXATION WITH A CELL,  RECTOCELE REPAIR WITH A CELL,   (N/A)  LYNX SLING  (N/A)  Subjective: Patient reports incisional pain.    Objective: I have reviewed patient's vital signs.  General: alert GI: soft, non-tender; bowel sounds normal; no masses,  no organomegaly and incision: clean Vaginal Bleeding: minimal  Assessment: s/p Procedure(s): LAPAROSCOPIC ASSISTED VAGINAL HYSTERECTOMY WITH BILATERAL SALPINGO OOHORECTOMY   (Bilateral) CYSTOSCOPY WITH BILATERAL RETROGRADE PYELOGRAM, AND BILATERAL URETERAL STENTS (Bilateral)  ANTERIOR VAGINAL VAULT PROLAPSE REPAIR WITH KELLY PLICATION SACRAL SPINOUS FIXATION WITH A CELL,  RECTOCELE REPAIR WITH A CELL,   (N/A)  LYNX SLING  (N/A): stable  Plan: Encourage ambulation ileus seems to be resolving   LOS: 3 days    Jacarie Pate S 07/17/2014, 9:21 AM

## 2014-07-17 NOTE — Progress Notes (Signed)
Patient has ambulated X5 today in hallways with minimal assistance. Patient went to restroom at 1845 and had small bowel movement. Patient feels better at this time, but not passing flatus. Patient ambulating in hallway at this time. Will continue to monitor patient.Setzer, Marchelle Folks

## 2014-07-17 NOTE — Progress Notes (Signed)
Urology Progress Note  3 Days Post-Op  Ileus: Abd soft. BS : few. Non-tender. No flatus,  Subjective:     No acute urologic events overnight. Ambulation:   positive Flatus:    negative Bowel movement  negative  Pain: No pain  Objective:  Blood pressure 113/48, pulse 79, temperature 99.1 F (37.3 C), temperature source Oral, resp. rate 16, height 5\' 1"  (1.549 m), weight 62.3 kg (137 lb 5.6 oz), SpO2 94.00%.  Physical Exam:  General:  No acute distress, awake Ext: resolving femoral nerve palsy: not a significant problem at this time.   Genitourinary:   Voiding well. No significant pvr.  Foley:  Out.     I/O last 3 completed shifts: In: 2362.5 [P.O.:120; I.V.:2242.5] Out: 3311 [Urine:3311]  Recent Labs     07/14/14  1416  07/15/14  0407  07/16/14  0840  HGB  12.7  12.3  12.0  WBC  12.6*   --   14.7*  PLT  250   --   252    Recent Labs     07/14/14  1416  07/16/14  0840  NA   --   128*  K   --   3.8  CL   --   89*  CO2   --   25  BUN   --   9  CREATININE  0.52  0.83  CALCIUM   --   8.7  GFRNONAA  89*  65*  GFRAA  >90  76*     No results found for this basename: PT, INR, APTT,  in the last 72 hours   No components found with this basename: ABG,   Assessment/Plan:  P: 1. Fleets enema ( gentle). Continue dulcolax prn. Home planning for AM, if possible. May need GI follow-up.

## 2014-07-18 MED ORDER — TRAMADOL-ACETAMINOPHEN 37.5-325 MG PO TABS: 1.0000 | ORAL_TABLET | Freq: Four times a day (QID) | ORAL | Status: DC | PRN

## 2014-07-18 NOTE — Progress Notes (Signed)
Urology Progress Note  4 Days Post-Op  1. Ileus: Flatus this AM  + BM this AM. Still has some abd.  tenderness and some bloating.  2. femoral n. Palsy: continues to resolve. Normal sensation. Motor almost resolved.  Subjective:     No acute urologic events overnight. Ambulation:   positive Flatus:    positive Bowel movement  positive  Pain: complete resolution. Taking almost no pain med, but still has some abd soreness, and milf vaginal drainage.   Objective:  Blood pressure 128/59, pulse 96, temperature 98.3 F (36.8 C), temperature source Oral, resp. rate 18, height 5\' 1"  (1.549 m), weight 62.3 kg (137 lb 5.6 oz), SpO2 98.00%.  Physical Exam:  General:  No acute distress, awake ABD: soft. no rebound. mil, diffuse tenderness, some bloating. + BS Genitourinary:   Some serosanguineous vaginal drainage Foley: out Extremities: L femoral n. Palsy: normal sensation. Motor: 80-90% normal.     I/O last 3 completed shifts: In: 2776.3 [P.O.:120; I.V.:2656.3] Out: 3775 [Urine:3774; Stool:1]  Recent Labs     07/16/14  0840  HGB  12.0  WBC  14.7*  PLT  252    Recent Labs     07/16/14  0840  NA  128*  K  3.8  CL  89*  CO2  25  BUN  9  CREATININE  0.83  CALCIUM  8.7  GFRNONAA  65*  GFRAA  76*     No results found for this basename: PT, INR, APTT,  in the last 72 hours   No components found with this basename: ABG,   Assessment/Plan:  Wound care discussed. Diet discussed D/c today.

## 2014-07-18 NOTE — Discharge Instructions (Addendum)
Anterior and Posterior Colporrhaphy Anterior or posterior colporrhaphy is surgery to fix a prolapse of organs in the genital tract. Prolapse means the falling down, bulging, dropping, or drooping of an organ. Organs that commonly prolapse include the rectum, bladder, vagina, and uterus. Prolapse can affect a single organ or several organs at the same time. This often worsens when women stop having their monthly periods (menopause) because estrogen loss weakens the muscles and tissues in the genital tract. In addition, prolapse happens when the organs are damaged or weakened. This commonly happens after childbirth and as a result of aging. Surgery is often done for severe prolapses.  The type of colporrhaphy done depends on the type of genital prolapse. Types of genital prolapse include the following:   Cystocele. This is a prolapse of the upper (anterior) wall of the vagina. The anterior wall bulges into the vagina and brings the bladder with it.   Rectocele. This is a prolapse of the lower (posterior) wall of the vagina. The posterior vaginal wall bulges into the vagina and brings the rectum with it.   Enterocele. This is a prolapse of part of the pelvic organs called the pouch of Douglas. It also involves a portion of the small bowel. It appears as a bulge under the neck of the uterus at the top of the back wall of the vagina.   Procidentia. This is a complete prolapse of the uterus and the cervix. The prolapse can be seen and felt coming out of the vagina. LET [facility] CARE PROVIDER KNOW ABOUT:   Any allergies you have.   All medicines you are taking, including vitamins, herbs, eye drops, creams, and over-the-counter medicines.   Previous problems you or members of your family have had with the use of anesthetics.   Any blood disorders you have.   Previous surgeries you have had.   Medical conditions you have.   Smoking history or history of alcohol use.   Possibility of  pregnancy, if this applies.  RISKS AND COMPLICATIONS Generally, anterior or posterior colporrhaphy is a safe procedure. However, as with any procedure, complications can occur. Possible complications include:   Infection.   Damage to other organs during surgery.   Bleeding after surgery.   Problems urinating.   Problems from the anesthetic.  BEFORE THE PROCEDURE  Ask your health care provider about changing or stopping your regular medicines.   Do not eat or drink anything for at least 8 hours before the surgery.   If you smoke, do not smoke for at least 2 weeks before the surgery.   Make plans to have someone drive you home after your hospital stay. Also, arrange for someone to help you with activities during recovery. PROCEDURE  You may be given medicine to help you relax before the surgery (sedative). During the surgery you will be given medicine to make you sleep through the procedure (general anesthetic) or medicine to numb you from the waist down (spinal anesthetic). This medicine will be given through an intravenous (IV) access tube that is put into one of your veins.  The procedure will vary depending on the type of repair:   Anterior repair. A cut (incision) is made in the midline section of the front part of the vaginal wall. A triangular-shaped piece of vaginal tissue is removed, and the stronger, healthier tissue is sewn together in order to support and suspend the bladder.   Posterior repair. An incision is made midline on the back wall of the  vagina. A triangular portion of vaginal skin is removed to expose the muscle. Excess tissue is removed, and stronger, healthier muscle and ligament tissue is sewn together to support the rectum.   Anterior and posterior repair. Both procedures are done during the same surgery. AFTER THE PROCEDURE You will be taken to a recovery area. Your blood pressure, pulse, breathing, and temperature (vital signs) will be monitored.  This is done until you are stable. Then you will be transferred to a hospital room.  After surgery, you will have a small rubber tube in place to drain your bladder (urinary catheter). This will be in place for 2 to 7 days or until your bladder is working properly on its own. The IV access tube will be removed in 1 to 3 days. You may have a gauze packing in your vagina to prevent bleeding. This will be removed 2 or 3 days after the surgery. You will likely need to stay in the hospital for 3 to 5 days.  Document Released: 12/17/2003 Document Revised: 05/29/2013 Document Reviewed: 02/15/2013 [facility] Patient Information 2015 [employer], [place]. This information is not intended to replace advice given to you by your health care provider. Make sure you discuss any questions you have with your health care provider.  Abdominal Hysterectomy Abdominal hysterectomy is a surgical procedure to remove your womb (uterus). Your uterus is the muscular organ that contains a developing baby. This surgery is done for many reasons. You may need an abdominal hysterectomy if you have cancer, growths (tumors), long-term pain, or bleeding. You may also have this procedure if your uterus has slipped down into your vagina (uterine prolapse). Depending on why you need an abdominal hysterectomy, you may also have other reproductive organs removed. These could include the part of your vagina that connects with your uterus (cervix), the organs that make eggs (ovaries), and the tubes that connect the ovaries to the uterus (fallopian tubes). LET [facility] CARE PROVIDER KNOW ABOUT:   Any allergies you have.  All medicines you are taking, including vitamins, herbs, eye drops, creams, and over-the-counter medicines.  Previous problems you or members of your family have had with the use of anesthetics.  Any blood disorders you have.  Previous surgeries you have had.  Medical conditions you have. RISKS AND COMPLICATIONS Generally,  this is a safe procedure. However, as with any procedure, problems can occur. Infection is the most common problem after an abdominal hysterectomy. Other possible problems include:  Bleeding.  Formation of blood clots that may break free and travel to your lungs.  Injury to other organs near your uterus.  Nerve injury causing nerve pain.  Decreased interest in sex or pain during sexual intercourse. BEFORE THE PROCEDURE  Abdominal hysterectomy is a major surgical procedure. It can affect the way you feel about yourself. Talk to your health care provider about the physical and emotional changes hysterectomy may cause.  You may need to have blood work and X-rays done before surgery.  Quit smoking if you smoke. Ask your health care provider for help if you are struggling to quit.  Stop taking medicines that thin your blood as directed by your health care provider.  You may be instructed to take antibiotic medicines or laxatives before surgery.  Do not eat or drink anything for 6-8 hours before surgery.  Take your regular medicines with a small sip of water.  Bathe or shower the night or morning before surgery. PROCEDURE  Abdominal hysterectomy is done in the operating  room at the hospital.  In most cases, you will be given a medicine that makes you go to sleep (general anesthetic).  The surgeon will make a cut (incision) through the skin in your lower belly.  The incision may be about 5-7 inches long. It may go side-to-side or up-and-down.  The surgeon will move aside the body tissue that covers your uterus. The surgeon will then carefully take out your uterus along with any of your other reproductive organs that need to be removed.  Bleeding will be controlled with clamps or sutures.  The surgeon will close your incision with sutures or metal clips. AFTER THE PROCEDURE  You will have some pain immediately after the procedure.  You will be given pain medicine in the  recovery room.  You will be taken to your hospital room when you have recovered from the anesthesia.  You may need to stay in the hospital for 2-5 days.  You will be given instructions for recovery at home. Document Released: 10/01/2013 Document Reviewed: 10/01/2013 [facility] Patient Information 2015 [place], [place]. This information is not intended to replace advice given to you by your health care provider. Make sure you discuss any questions you have with your health care provider.

## 2014-07-18 NOTE — Discharge Summary (Signed)
Physician Discharge Summary  Patient ID: Sherry Olsen MRN: [id number] DOB/AGE: 78-May-1934 78 y.o.  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 Post op resolving Left femoral nerve palsy, with resolution of sensory protion prior to d/c, and 80-90% resolution of motor portion at time of d/c.   Consults: Physical Therapy  Significant Diagnostic Studies: Ct Abdomen Pelvis W Wo Contrast  07/16/2014   CLINICAL DATA:  Followup evaluation of 80 year old female currently on postoperative day 2 following laparoscopic hysterectomy and bilateral salpingo-oophorectomy with anterior vault repair and sling. Nausea, vomiting and elevated white blood cell count with generalized abdominal tenderness and abdominal distention. Evaluate for potential ileus. Evaluate for urinary tract calculus.  EXAM: CT ABDOMEN AND PELVIS WITHOUT AND WITH CONTRAST  TECHNIQUE: Multidetector CT imaging of the abdomen and pelvis was performed following the standard protocol before and following the bolus administration of intravenous contrast.  CONTRAST:  162mL OMNIPAQUE IOHEXOL 300 MG/ML  SOLN  COMPARISON:  CT of the abdomen and pelvis 07/10/2014.  FINDINGS: Lower chest: Multifocal opacities throughout the lung bases bilaterally are predominantly linear and therefore predominantly favored to reflect areas its postoperative subsegmental atelectasis. However, there is a small area of airspace  consolidation in the left lower lobe with some air bronchograms, concerning for an area of infectious consolidation. Several tiny peribronchovascular micronodules are also noted, most evident in the periphery of the right middle old, new compared to the prior examination, presumably areas of mucoid impaction within terminal bronchioles. Atherosclerotic calcifications in the left main, left anterior descending, left circumflex and right coronary arteries.  Hepatobiliary: Status post cholecystectomy. Focal area of hypoperfusion in the segment 4B of the liver is a benign perfusion anomaly. No focal hepatic lesions are noted. Minimal intrahepatic biliary ductal dilatation appears to be chronic and is unchanged compared to the prior study. Common bile duct is 7 mm in the porta hepatis.  Pancreas: Unremarkable.  Spleen: Unremarkable.  Adrenals/Urinary Tract: Compared to the prior examination there is now mild right and moderate left hydroureteronephrosis. No urinary tract calculi are noted. No definite obstructing ureteral lesion is identified on either side. On the left side, there is extensive enhancement of the urothelium throughout the left ureter, most evident distally where there is some soft tissue prominence at both of the ureterovesicular junctions (left greater than right). Delayed post-contrast images demonstrate good opacification of the right renal collecting system and right ureter, but pooling of contrast dependently within the left renal collecting system and failure to opacify the visualized left ureter, indicating significant left-sided obstruction. Associated with this there is extensive left-sided perinephric stranding. Post-contrast images demonstrate two 11 mm simple cysts in the interpolar and lower pole regions of the right kidney. In addition, there is a 1.6 x 1.5 cm lesion in the lower pole of the left kidney which appears to have some internal enhancing septations, highly concerning for a small  renal neoplasm. Adrenal glands are normal in appearance bilaterally. Small amount gas non dependently in the lumen of the urinary bladder.  Stomach/Bowel: The appearance  of the stomach is normal. No pathologic dilatation of small bowel or colon. Numerous colonic diverticulae are noted, particularly in the sigmoid colon, without definite surrounding inflammatory changes to suggest an acute diverticulitis at this time.  Vascular/Lymphatic: Extensive atherosclerosis throughout the abdominal and pelvic vasculature, without evidence of aneurysm or dissection. No definite pathologically enlarged abdominal or pelvic lymph nodes are noted.  Reproductive: Postoperative changes of total abdominal hysterectomy and bilateral salpingo-oophorectomy are noted. There is a trace amount of postoperative gas and fluid in the low anatomic pelvis. Severe pelvic floor laxity is again noted, with thinning of the levator ani musculature, and dilatation of the levator hiatus. Vaginal anatomy appears distorted, with some fluid and gas in the vagina, presumably related to recent surgery. Avid vaginal mucosal enhancement is noted (nonspecific). The base of the urinary bladder now appears above the pubococcygeal line, suggesting interval resuspension of the bladder. The vaginal vault and rectum are low lying below the level of the pubococcygeal line level indicative of residual laxity. There is a small amount of gas along the right pelvic sidewall.  Other: Trace volume of free fluid in the cul-de-sac. No significant volume of pneumoperitoneum.  Musculoskeletal: Gas in the subcutaneous fat of the lower anterior abdominal wall and the underlying musculature, within normal limits in this patient postoperative day 2. There are no aggressive appearing lytic or blastic lesions noted in the visualized portions of the skeleton.  IMPRESSION: 1. No evidence of ileus or bowel obstruction. 2. Postoperative changes related to recent total abdominal  hysterectomy and bilateral salpingo oophorectomy with anterior vaginal vault repair and sling procedure. There continues to be evidence of pelvic floor laxity, as discussed above, although the base of the urinary bladder is now well above the level of the pubococcygeal line at rest. 3. Interval development of bilateral hydroureteronephrosis, very mild on the right and moderate on the left, with extensive left-sided perinephric and periureteric stranding, enhancement of the left ureteral urothelium, and soft tissue fullness in the region of the left ureterovesicular junction. These findings are favored to reflect some postoperative edema along the posterior bladder wall in the region of the left ureterovesicular junction, as there is no discrete obstructing lesion otherwise noted. 4. Multifocal opacities throughout the lung bases bilaterally, predominantly atelectasis, although there does appear to be a small area of airspace consolidation in the left lower lobe, which could represent a focus of developing infection. 5. 1.6 x 1.5 cm enhancing multiseptated lesion in the lower pole of the left kidney remains highly concerning for small renal neoplasm. Followup nonemergent MRI of the abdomen with and without IV gadolinium is strongly recommended for definitive characterization. 6. Additional findings, as above.   Electronically Signed   By: Vinnie Langton M.D.   On: 07/16/2014 13:00    Treatments: Surgery  Discharge Exam: Blood pressure 128/59, pulse 96, temperature 98.3 F (36.8 C), temperature source Oral, resp. rate 18, height 5\' 1"  (1.549 m), weight 62.3 kg (137 lb 5.6 oz), SpO2 98.00%. ABD: + BS, less tender. + gas.   Disposition: 01-Home or Self Care  Discharge Instructions   Discharge patient    Complete by:  As directed      Discontinue IV    Complete by:  As directed             Medication List         acetaminophen 500 MG tablet  Commonly known as:  TYLENOL  Take 500 mg by mouth  every 6 (six) hours as  needed. For pain     aspirin 81 MG chewable tablet  Chew 81 mg by mouth every other day.     atorvastatin 10 MG tablet  Commonly known as:  LIPITOR  Take 5 mg by mouth every evening.     BENICAR 20 MG tablet  Generic drug:  olmesartan  Take 20 mg by mouth every evening.     BENTYL 10 MG capsule  Generic drug:  dicyclomine  Take 10 mg by mouth every morning.     calcitonin (salmon) 200 UNIT/ACT nasal spray  Commonly known as:  MIACALCIN/FORTICAL  Place 1 spray into the nose daily.     calcium carbonate 750 MG chewable tablet  Commonly known as:  TUMS EX  Chew 1 tablet by mouth 3 (three) times daily as needed. For indigestion     calcium citrate-vitamin D 315-200 MG-UNIT per tablet  Commonly known as:  CITRACAL+D  Take 4 tablets by mouth daily.     diazepam 5 MG tablet  Commonly known as:  VALIUM  Take 5 mg by mouth once as needed for anxiety.     fish oil-omega-3 fatty acids 1000 MG capsule  Take 1 g by mouth 2 (two) times daily.     fluticasone 50 MCG/ACT nasal spray  Commonly known as:  FLONASE  Place 2 sprays into both nostrils daily as needed for allergies.     GAS-X EXTRA STRENGTH 125 MG Caps  Generic drug:  Simethicone  Take 1 capsule by mouth 3 (three) times daily as needed. For indigestion     GENTEAL 0.25-0.3 % Gel  Generic drug:  Carboxymethylcell-Hypromellose  Apply 1 drop to eye at bedtime as needed (dry eyes).     lansoprazole 30 MG capsule  Commonly known as:  PREVACID  Take 60 mg by mouth every morning.     nebivolol 5 MG tablet  Commonly known as:  BYSTOLIC  Take 2.5 mg by mouth every morning.     OVER THE COUNTER MEDICATION  Take 2 tablets by mouth 2 (two) times daily. Ocular Nutrition with Lutein eye vitamin     polyethylene glycol packet  Commonly known as:  MIRALAX / GLYCOLAX  Take 8.5 g by mouth at bedtime.     RESTASIS 0.05 % ophthalmic emulsion  Generic drug:  cycloSPORINE  Place 1 drop into both eyes 2  (two) times daily.     SYNTHROID 88 MCG tablet  Generic drug:  levothyroxine  Take 88 mcg by mouth daily before breakfast.     traMADol-acetaminophen 37.5-325 MG per tablet  Commonly known as:  ULTRACET  Take 1 tablet by mouth every 6 (six) hours as needed.           Follow-up Information   Follow up with Carolan Clines I, MD. (per appointment. See Dr. Radene Knee per appointment)    Specialty:  Urology   Contact information:   [address] [place] [address] [phone]      Discussed return to prune juice BID and Miralax.  SignedCarolan Clines I 07/18/2014, 8:23 AM

## 2014-07-18 NOTE — Progress Notes (Signed)
Dischrge to home,no complaints of any pain or discomfort upon discharge.  PIV removed by NT. /c instructions and follow up appointments done by CN.

## 2014-07-25 ENCOUNTER — Other Ambulatory Visit: Payer: Self-pay

## 2014-11-11 ENCOUNTER — Encounter (HOSPITAL_COMMUNITY): Payer: Self-pay | Admitting: Obstetrics and Gynecology

## 2014-11-21 ENCOUNTER — Other Ambulatory Visit (HOSPITAL_COMMUNITY): Payer: Self-pay | Admitting: Urology

## 2014-11-21 DIAGNOSIS — N289 Disorder of kidney and ureter, unspecified: Secondary | ICD-10-CM

## 2014-11-26 IMAGING — NM NM [ID] THYROID CANCER METS WHOLE BODY W/ THYROGEN
6 series · 6 of 6 positions shown · non-contrast
Comparison: Whole-body I 131 scan 06/21/2013

CLINICAL DATA: Papillary thyroid cancer.Papillary thyroid cancer
post resection in 3801,
underwent radioactive iodine therapy on 10/19/2012 utilizing
mCi of 4-KTK [place].

EXAM:
THYROGEN-STIMULATED 4-KTK WHOLE BODY SCAN
TECHNIQUE: The patient received 0.9 mg Thyrogen intramuscularly every 24 hours
for two doses. On the third day the patient returned and received
the radiopharmaceutical, per orally. On the fifth day, the patient
returned and whole body planar images were obtained in the anterior
and posterior projections.
RADIOPHARMACEUTICALS:  4.0 mUiI-6V6 sodium iodide

[Series 1: marker · 4.14mm/px · 1 of 1 slices shown (1 of 2)]
[im 1/1  full-range]
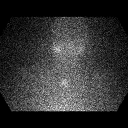

[Series 1: marker · 4.14mm/px · 1 of 1 slices shown (2 of 2)]
[im 1/1]
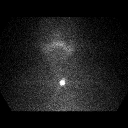

[Series 2: static thyroid no marker · 4.14mm/px · 1 of 1 slices shown (1 of 2)]
[im 1/1]
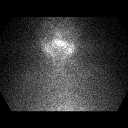

[Series 2: static thyroid no marker · 4.14mm/px · 1 of 1 slices shown (2 of 2)]
[im 1/1]
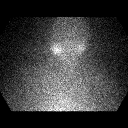

[Series 3: i131 whole body · 2.66mm/px · 1 of 1 slices shown (1 of 2)]
[im 1/1]
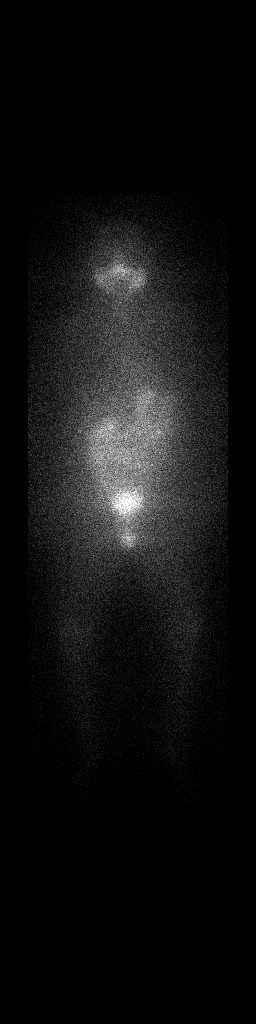

[Series 3: i131 whole body · 2.66mm/px · 1 of 1 slices shown (2 of 2)]
[im 1/1]
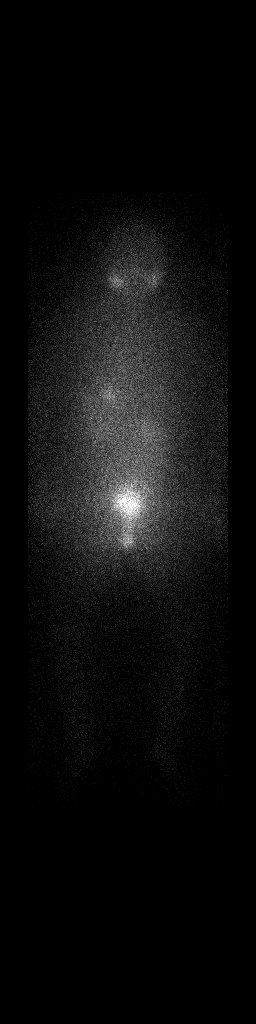

[6 of 6 positions shown; findings below may reference images not displayed]

FINDINGS: No abnormal uptake within the thyroid bed. No abnormal uptake on the
whole-body I 131 scan. Physiologic activity noted in the GI and GU
tract.
IMPRESSION: No evidence of local thyroid cancer recurrence or distant
metastasis.

## 2014-12-03 ENCOUNTER — Ambulatory Visit (HOSPITAL_COMMUNITY)
Admission: RE | Admit: 2014-12-03 | Discharge: 2014-12-03 | Disposition: A | Discharge status: Home / Self Care | Payer: Medicare Other | Source: Ambulatory Visit | Attending: Urology | Admitting: Urology

## 2014-12-03 DIAGNOSIS — N289 Disorder of kidney and ureter, unspecified: Secondary | ICD-10-CM

## 2014-12-12 ENCOUNTER — Ambulatory Visit (HOSPITAL_COMMUNITY)
Admission: RE | Admit: 2014-12-12 | Discharge: 2014-12-12 | Disposition: A | Discharge status: Home / Self Care | Payer: Medicare Other | Source: Ambulatory Visit | Attending: Urology | Admitting: Urology

## 2014-12-12 DIAGNOSIS — N289 Disorder of kidney and ureter, unspecified: Secondary | ICD-10-CM | POA: Diagnosis not present

## 2014-12-12 DIAGNOSIS — K760 Fatty (change of) liver, not elsewhere classified: Secondary | ICD-10-CM | POA: Insufficient documentation

## 2014-12-12 LAB — POCT I-STAT CREATININE: Creatinine, Ser: 0.7 mg/dL (ref 0.50–1.10)

## 2014-12-12 MED ORDER — GADOBENATE DIMEGLUMINE 529 MG/ML IV SOLN
15.0000 mL | Freq: Once | INTRAVENOUS | Status: AC | PRN
  Administered 2014-12-12: 13 mL via INTRAVENOUS

## 2014-12-18 ENCOUNTER — Other Ambulatory Visit: Payer: Self-pay | Admitting: Urology

## 2015-01-26 NOTE — Patient Instructions (Signed)
YOUR PROCEDURE IS SCHEDULED ON :  02/02/15  REPORT TO [facility] MAIN ENTRANCE FOLLOW SIGNS TO SHORT STAY CENTER AT :  6:30 AM  CALL THIS NUMBER IF YOU HAVE PROBLEMS THE MORNING OF SURGERY [phone]  REMEMBER:  DO NOT EAT FOOD OR DRINK LIQUIDS AFTER MIDNIGHT  TAKE THESE MEDICINES THE MORNING OF SURGERY:  YOU MAY NOT HAVE ANY METAL ON YOUR BODY INCLUDING HAIR PINS AND PIERCING'S. DO NOT WEAR JEWELRY, MAKEUP, LOTIONS, POWDERS OR PERFUMES. DO NOT WEAR NAIL POLISH. DO NOT SHAVE 48 HRS PRIOR TO SURGERY. MEN MAY SHAVE FACE AND NECK.  DO NOT [place]. [facility] IS NOT RESPONSIBLE FOR VALUABLES.  CONTACTS, DENTURES OR PARTIALS MAY NOT BE WORN TO SURGERY. LEAVE SUITCASE IN CAR. CAN BE BROUGHT TO ROOM AFTER SURGERY.  PATIENTS DISCHARGED THE DAY OF SURGERY WILL NOT BE ALLOWED TO DRIVE HOME.  PLEASE READ OVER THE FOLLOWING INSTRUCTION SHEETS _________________________________________________________________________________                                          [facility] - PREPARING FOR SURGERY  Before surgery, you can play an important role.  Because skin is not sterile, your skin needs to be as free of germs as possible.  You can reduce the number of germs on your skin by washing with CHG (chlorahexidine gluconate) soap before surgery.  CHG is an antiseptic cleaner which kills germs and bonds with the skin to continue killing germs even after washing. Please DO NOT use if you have an allergy to CHG or antibacterial soaps.  If your skin becomes reddened/irritated stop using the CHG and inform your nurse when you arrive at Short Stay. Do not shave (including legs and underarms) for at least 48 hours prior to the first CHG shower.  You may shave your face. Please follow these instructions carefully:   1.  Shower with CHG Soap the night before surgery and the  morning of Surgery.   2.  If you choose to wash your hair, wash your hair first as usual  with your  normal  Shampoo.   3.  After you shampoo, rinse your hair and body thoroughly to remove the  shampoo.                                         4.  Use CHG as you would any other liquid soap.  You can apply chg directly  to the skin and wash . Gently wash with scrungie or clean wascloth    5.  Apply the CHG Soap to your body ONLY FROM THE NECK DOWN.   Do not use on open                           Wound or open sores. Avoid contact with eyes, ears mouth and genitals (private parts).                        Genitals (private parts) with your normal soap.              6.  Wash thoroughly, paying special attention to the area where your surgery  will be performed.   7.  Thoroughly rinse your body with warm water from the neck down.   8.  DO NOT shower/wash with your normal soap after using and rinsing off  the CHG Soap .                9.  Pat yourself dry with a clean towel.             10.  Wear clean night clothes to bed after shower             11.  Place clean sheets on your bed the night of your first shower and do not  sleep with pets.  Day of Surgery : Do not apply any lotions/deodorants the morning of surgery.  Please wear clean clothes to the hospital/surgery center.  FAILURE TO FOLLOW THESE INSTRUCTIONS MAY RESULT IN THE CANCELLATION OF YOUR SURGERY    PATIENT SIGNATURE_________________________________  ______________________________________________________________________    Adam Phenix  An incentive spirometer is a tool that can help keep your lungs clear and active. This tool measures how well you are filling your lungs with each breath. Taking long deep breaths may help reverse or decrease the chance of developing breathing (pulmonary) problems (especially infection) following:  A long period of time when you are unable to move or be active. BEFORE THE PROCEDURE   If the spirometer includes an indicator to show your best effort, your nurse or  respiratory therapist will set it to a desired goal.  If possible, sit up straight or lean slightly forward. Try not to slouch.  Hold the incentive spirometer in an upright position. INSTRUCTIONS FOR USE  1. Sit on the edge of your bed if possible, or sit up as far as you can in bed or on a chair. 2. Hold the incentive spirometer in an upright position. 3. Breathe out normally. 4. Place the mouthpiece in your mouth and seal your lips tightly around it. 5. Breathe in slowly and as deeply as possible, raising the piston or the ball toward the top of the column. 6. Hold your breath for 3-5 seconds or for as long as possible. Allow the piston or ball to fall to the bottom of the column. 7. Remove the mouthpiece from your mouth and breathe out normally. 8. Rest for a few seconds and repeat Steps 1 through 7 at least 10 times every 1-2 hours when you are awake. Take your time and take a few normal breaths between deep breaths. 9. The spirometer may include an indicator to show your best effort. Use the indicator as a goal to work toward during each repetition. 10. After each set of 10 deep breaths, practice coughing to be sure your lungs are clear. If you have an incision (the cut made at the time of surgery), support your incision when coughing by placing a pillow or rolled up towels firmly against it. Once you are able to get out of bed, walk around indoors and cough well. You may stop using the incentive spirometer when instructed by your caregiver.  RISKS AND COMPLICATIONS  Take your time so you do not get dizzy or light-headed.  If you are in pain, you may need to take or ask for pain medication before doing incentive spirometry. It is harder to take a deep breath if you are having pain. AFTER USE  Rest and breathe slowly and easily.  It can be helpful to keep track of a log of your progress. Your caregiver can provide you with a simple  table to help with this. If you are using the  spirometer at home, follow these instructions: [place] IF:   You are having difficultly using the spirometer.  You have trouble using the spirometer as often as instructed.  Your pain medication is not giving enough relief while using the spirometer.  You develop fever of 100.5 F (38.1 C) or higher. SEEK IMMEDIATE MEDICAL CARE IF:   You cough up bloody sputum that had not been present before.  You develop fever of 102 F (38.9 C) or greater.  You develop worsening pain at or near the incision site. MAKE SURE YOU:   Understand these instructions.  Will watch your condition.  Will get help right away if you are not doing well or get worse. Document Released: 02/06/2007 Document Revised: 12/19/2011 Document Reviewed: 04/09/2007 [facility] Patient Information 2014 [place], [place].   ________________________________________________________________________

## 2015-01-27 ENCOUNTER — Inpatient Hospital Stay (HOSPITAL_COMMUNITY)
Admission: RE | Admit: 2015-01-27 | Discharge: 2015-01-27 | Disposition: A | Discharge status: Home / Self Care | Payer: Medicare Other | Source: Ambulatory Visit

## 2015-01-28 ENCOUNTER — Encounter (HOSPITAL_COMMUNITY): Payer: Self-pay

## 2015-01-30 ENCOUNTER — Encounter (HOSPITAL_COMMUNITY): Payer: Self-pay

## 2015-01-30 ENCOUNTER — Encounter (HOSPITAL_COMMUNITY)
Admission: RE | Admit: 2015-01-30 | Discharge: 2015-01-30 | Disposition: A | Discharge status: Home / Self Care | Payer: Medicare Other | Source: Ambulatory Visit | Attending: General Surgery | Admitting: General Surgery

## 2015-01-30 ENCOUNTER — Ambulatory Visit (HOSPITAL_COMMUNITY)
Admission: RE | Admit: 2015-01-30 | Discharge: 2015-01-30 | Disposition: A | Discharge status: Home / Self Care | Payer: Medicare Other | Source: Ambulatory Visit | Attending: Anesthesiology | Admitting: Anesthesiology

## 2015-01-30 DIAGNOSIS — R9389 Abnormal findings on diagnostic imaging of other specified body structures: Secondary | ICD-10-CM

## 2015-01-30 DIAGNOSIS — R938 Abnormal findings on diagnostic imaging of other specified body structures: Secondary | ICD-10-CM | POA: Insufficient documentation

## 2015-01-30 DIAGNOSIS — I10 Essential (primary) hypertension: Secondary | ICD-10-CM | POA: Diagnosis not present

## 2015-01-30 HISTORY — DX: Angina pectoris, unspecified: I20.9

## 2015-01-30 HISTORY — DX: Atherosclerotic heart disease of native coronary artery without angina pectoris: I25.10

## 2015-01-30 HISTORY — DX: Anxiety disorder, unspecified: F41.9

## 2015-01-30 HISTORY — DX: Bronchitis, not specified as acute or chronic: J40

## 2015-01-30 HISTORY — DX: Pain in leg, unspecified: M79.606

## 2015-01-30 LAB — CBC
HCT: 44.8 % (ref 36.0–46.0)
Hemoglobin: 14.2 g/dL (ref 12.0–15.0)
MCH: 28.6 pg (ref 26.0–34.0)
MCHC: 31.7 g/dL (ref 30.0–36.0)
MCV: 90.1 fL (ref 78.0–100.0)
PLATELETS: 323 10*3/uL (ref 150–400)
RBC: 4.97 MIL/uL (ref 3.87–5.11)
RDW: 14.2 % (ref 11.5–15.5)
WBC: 6.4 10*3/uL (ref 4.0–10.5)

## 2015-01-30 LAB — BASIC METABOLIC PANEL
ANION GAP: 8 (ref 5–15)
BUN: 9 mg/dL (ref 6–23)
CO2: 29 mmol/L (ref 19–32)
CREATININE: 0.52 mg/dL (ref 0.50–1.10)
Calcium: 9.4 mg/dL (ref 8.4–10.5)
Chloride: 98 mmol/L (ref 96–112)
GFR calc Af Amer: >90 mL/min (ref >90)
GFR calc non Af Amer: 89 mL/min — ABNORMAL LOW (ref >90)
GLUCOSE: 93 mg/dL (ref 70–99)
Potassium: 4.7 mmol/L (ref 3.5–5.1)
Sodium: 135 mmol/L (ref 135–145)

## 2015-01-30 NOTE — Patient Instructions (Signed)
[place]  01/30/2015   Your procedure is scheduled on:     Monday February 02, 2015   Report to [facility] Main Entrance and follow signs to  [place] arrive at 6:30 AM.   Call this number if you have problems the morning of surgery [phone] or Presurgical Testing [phone].   Remember:  Do not eat food or drink liquids :After Midnight.  For Living Will and/or Health Care Power Attorney Forms: please provide copy for your medical record, may bring AM of surgery (forms should be already notarized-we do not provide this service).  Remember: follow any bowel prep instructions per MD office.     Take these medicines the morning of surgery: Calcitonin Nasal Spray; Restasis eye drops if needed; Diazepam (Valium) if needed; Flonase if needed; Lansoprazole (Prevacid); Levothyroxine; Bystolic                                You may not have any metal on your body including hair pins and piercings  Do not wear jewelry, make-up, lotions, powders, perfumes, nail polish or deodorant.  Do not shave body hair  48 hours(2 days) of CHG soap use.               Do not bring valuables to the hospital. [name]  Contacts, dentures or bridgework may not be worn into surgery.  Leave suitcase in the car. After surgery it may be brought to your room.  For patients admitted to the hospital, checkout time is 11:00 AM the day of discharge.     Special Instructions: review fact sheets for Incentive Spirometry. ________________________________________________________________________  [facility] - Preparing for Surgery Before surgery, you can play an important role.  Because skin is not sterile, your skin needs to be as free of germs as possible.  You can reduce the number of germs on your skin by washing with CHG (chlorahexidine gluconate) soap before surgery.  CHG is an antiseptic cleaner which kills germs and bonds with the skin to continue  killing germs even after washing. Please DO NOT use if you have an allergy to CHG or antibacterial soaps.  If your skin becomes reddened/irritated stop using the CHG and inform your nurse when you arrive at Short Stay. Do not shave (including legs and underarms) for at least 48 hours prior to the first CHG shower.  You may shave your face/neck. Please follow these instructions carefully:  1.  Shower with CHG Soap the night before surgery and the  morning of Surgery.  2.  If you choose to wash your hair, wash your hair first as usual with your  normal  shampoo.  3.  After you shampoo, rinse your hair and body thoroughly to remove the  shampoo.                           4.  Use CHG as you would any other liquid soap.  You can apply chg directly  to the skin and wash                       Gently with a scrungie or clean washcloth.  5.  Apply the CHG Soap to your body ONLY FROM THE NECK DOWN.   Do not use on face/ open  Wound or open sores. Avoid contact with eyes, ears mouth and genitals (private parts).                       Wash face,  Genitals (private parts) with your normal soap.             6.  Wash thoroughly, paying special attention to the area where your surgery  will be performed.  7.  Thoroughly rinse your body with warm water from the neck down.  8.  DO NOT shower/wash with your normal soap after using and rinsing off  the CHG Soap.                9.  Pat yourself dry with a clean towel.            10.  Wear clean pajamas.            11.  Place clean sheets on your bed the night of your first shower and do not  sleep with pets. Day of Surgery : Do not apply any lotions/deodorants the morning of surgery.  Please wear clean clothes to the hospital/surgery center.  FAILURE TO FOLLOW THESE INSTRUCTIONS MAY RESULT IN THE CANCELLATION OF YOUR SURGERY PATIENT SIGNATURE_________________________________  NURSE  SIGNATURE__________________________________  ________________________________________________________________________   Sherry Olsen  An incentive spirometer is a tool that can help keep your lungs clear and active. This tool measures how well you are filling your lungs with each breath. Taking long deep breaths may help reverse or decrease the chance of developing breathing (pulmonary) problems (especially infection) following:  A long period of time when you are unable to move or be active. BEFORE THE PROCEDURE   If the spirometer includes an indicator to show your best effort, your nurse or respiratory therapist will set it to a desired goal.  If possible, sit up straight or lean slightly forward. Try not to slouch.  Hold the incentive spirometer in an upright position. INSTRUCTIONS FOR USE  1. Sit on the edge of your bed if possible, or sit up as far as you can in bed or on a chair. 2. Hold the incentive spirometer in an upright position. 3. Breathe out normally. 4. Place the mouthpiece in your mouth and seal your lips tightly around it. 5. Breathe in slowly and as deeply as possible, raising the piston or the ball toward the top of the column. 6. Hold your breath for 3-5 seconds or for as long as possible. Allow the piston or ball to fall to the bottom of the column. 7. Remove the mouthpiece from your mouth and breathe out normally. 8. Rest for a few seconds and repeat Steps 1 through 7 at least 10 times every 1-2 hours when you are awake. Take your time and take a few normal breaths between deep breaths. 9. The spirometer may include an indicator to show your best effort. Use the indicator as a goal to work toward during each repetition. 10. After each set of 10 deep breaths, practice coughing to be sure your lungs are clear. If you have an incision (the cut made at the time of surgery), support your incision when coughing by placing a pillow or rolled up towels firmly  against it. Once you are able to get out of bed, walk around indoors and cough well. You may stop using the incentive spirometer when instructed by your caregiver.  RISKS AND COMPLICATIONS  Take your time so you do not  get dizzy or light-headed.  If you are in pain, you may need to take or ask for pain medication before doing incentive spirometry. It is harder to take a deep breath if you are having pain. AFTER USE  Rest and breathe slowly and easily.  It can be helpful to keep track of a log of your progress. Your caregiver can provide you with a simple table to help with this. If you are using the spirometer at home, follow these instructions: [place] IF:   You are having difficultly using the spirometer.  You have trouble using the spirometer as often as instructed.  Your pain medication is not giving enough relief while using the spirometer.  You develop fever of 100.5 F (38.1 C) or higher. SEEK IMMEDIATE MEDICAL CARE IF:   You cough up bloody sputum that had not been present before.  You develop fever of 102 F (38.9 C) or greater.  You develop worsening pain at or near the incision site. MAKE SURE YOU:   Understand these instructions.  Will watch your condition.  Will get help right away if you are not doing well or get worse. Document Released: 02/06/2007 Document Revised: 12/19/2011 Document Reviewed: 04/09/2007 [facility] Patient Information 2014 [place], [place].   ________________________________________________________________________

## 2015-01-30 NOTE — Progress Notes (Signed)
EKG per chart 08/21/2014  Stress ECHO per chart 09/10/2014 OV note per Dr Donnetta Hutching on chart 08/21/2014

## 2015-02-01 MED ORDER — GENTAMICIN SULFATE 40 MG/ML IJ SOLN
5.0000 mg/kg | INTRAVENOUS | Status: DC
  Administered 2015-02-02: 260 mg via INTRAVENOUS
  Filled 2015-02-01 (×2): qty 6.5

## 2015-02-01 NOTE — H&P (Signed)
Reason For Visit 4 mo f/u   Active Problems Problems  1. Complete Vaginal Cuff Prolapse   Assessed By: Carolan Clines (Urology); Last Assessed: 15 Dec 2014 2. Renal lesion (N28.9)   Assessed By: Carolan Clines (Urology); Last Assessed: 15 Dec 2014  History of Present Illness    82 yo female returns today for a 4 mo f/u & to review MRI results for hx of Lt renal lesion. She is s/p cysto/bil RPG/bil JJ stents/Anterior repair with Kelly plication, ACell implant/Lynx sling and hysterectomy by Dr. Radene Knee on 07/14/14. Pt felt that she started to fail in December, and feels that bladder is beginning to fall down. She has had severe bronchitis ( "worst case I ever had").       Originally referred by Dr. Radene Knee for evaluation of pelvic floor prolapse and urinary incontinence. The patient has worsening cystocele, with uterine descent over the last 10 years. 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 She has had attempts at pessary placement, but they will not stay in place. She is para 6-5-1.    Review of systems is significant for urinary frequency, nocturia, hesitancy, difficulty initiating her urinary stream, and urinary straining. On physical examination, the patient has urethral hypermobility, poorly estrogenized vagina, uterine prolapse, and stage IV midline cystocele. No enterocele was identified. Midline stage II rectocele is identified.    Urodynamics on 05/20/14, shows a strong desire to void at 106 cc. There is a low amplitude unstable bladder contraction of 7 cm of water, with no leakage.    Leak point pressure: The cough leak point pressure at 100 cc is 69 cm water, with mild urinary leakage. The Valsalva leak point pressure at 200 cc is 34 with mild to moderate leakage.    Pressure flow study: The patient voided a maximum volume of 227 cc with a flow rate of 10 cc/s. Detrusor pressure at maximum flow is 5 cm H20, with maximum detrusor  pressure of 67 cm water. PVR is 140 cc. The contraction is weak, and not well sustained. The patient voided with vaginal packing in place.    Mrs. Kessner has a maximum capacity of 385 cc. She has stress urinary incontinence with her prolapse reduced. There was no incontinence prior to the reduction of her prolapse. She has a weak bladder contraction, which is not well sustained. She did not empty well, leaving a post void residual of 140 cc. No reflux was identified by fluoroscopy.   Past Medical History Problems  1. History of arthritis (Z87.39) 2. History of esophageal reflux (Z87.19) 3. History of hyperlipidemia (Z86.39) 4. History of hypertension (Z86.79) 5. History of irritable bowel syndrome (Z87.19) 6. History of thyroid disease (Z86.39) 7. 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 History of Adenoidectomy 2. History of Appendectomy 3. History of Cervical Vertebral Fusion 4. History of Cholecystectomy 5. History of Excision Of Bartholin's Gland Or Cyst 6. History of Hemorrhoidectomy 7. History of Thyroid Surgery 8. History of Tonsillectomy  Current Meds 1. Aspirin 81 MG Oral Tablet;  Therapy: (Recorded:28Jul2015) to Recorded 2. Atorvastatin Calcium 10 MG Oral Tablet;  Therapy: (Recorded:28Jul2015) to Recorded 3. Benicar TABS;  Therapy: (Recorded:28Jul2015) to Recorded 4. Benzonatate CAPS;  Therapy: (Recorded:07Mar2016) to Recorded 5. Bystolic TABS;  Therapy: (Recorded:28Jul2015) to Recorded 6. Calcitonin ([place]) SOLN;  Therapy: (Recorded:28Jul2015) to Recorded 7. Citracal TABS;  Therapy: (Recorded:28Jul2015) to Recorded 8. Eye Vitamins CAPS;  Therapy: (Recorded:28Jul2015) to Recorded 9.  Fish Oil CAPS;  Therapy: (Recorded:28Jul2015) to Recorded 10. Nitrofurantoin Macrocrystal 100 MG Oral Capsule; Take 1 capsule twice   daily;   Therapy: [phone] to (Last Rx:14Aug2015) Ordered 11. Phenazopyridine HCl - 200 MG Oral Tablet; Take 1  tablet the night before   procedure & 1 tablet the morning of procedure with just a sip of water;   Therapy: 28Aug2015 to (Last Rx:28Aug2015) Ordered 12. Prevacid CPDR;   Therapy: (Recorded:28Jul2015) to Recorded 13. Restasis 0.05 % Ophthalmic Emulsion;   Therapy: (Recorded:28Jul2015) to Recorded 14. Synthroid 88 MCG Oral Tablet;   Therapy: (Recorded:28Jul2015) to Recorded 15. Tamsulosin HCl - 0.4 MG Oral Capsule; TAKE 1 CAPSULE Daily;   Therapy: 10Oct2015 to (Evaluate:09Nov2015); Last Rx:10Oct2015   Ordered 16. Tramadol-Acetaminophen 37.5-325 MG Oral Tablet; TAKE 1 TABLET 3   TIMES DAILY AS NEEDED;   Therapy: 13Oct2015 to (Evaluate:22Nov2015); Last Rx:13Oct2015   Ordered  Allergies Medication  1. Sulfa Drugs 2. Cipro 3. Fosamax 4. ibuprofen 5. Keflex TABS 6. Penicillins 7. PriLOSEC PACK 8. tetracycline  Family History Problems  1. Family history of colon cancer (Z80.0) : Father 2. Family history of pulmonary fibrosis (Z83.6) : Mother  Social History Problems  1. Alcohol use (Z78.9) 2. Caffeine use (F15.90) 3. Five children   1 son, 4 dtrs 4. Married 5. Never a smoker  Review of Systems Genitourinary, constitutional, skin, eye, otolaryngeal, hematologic/lymphatic, cardiovascular, pulmonary, endocrine, musculoskeletal, gastrointestinal, neurological and psychiatric system(s) were reviewed and pertinent findings if present are noted and are otherwise negative.    Vitals Vital Signs [Data Includes: Last 1 Day]  Recorded: [id number] 02:47PM  Blood Pressure: 175 / 73 Temperature: 97.8 F Heart Rate: 68  Physical Exam Constitutional: Well nourished and well developed . No acute distress.  ENT:. The ears and nose are normal in appearance.  Neck: The appearance of the neck is normal and no neck mass is present.  Pulmonary: No respiratory distress.  Cardiovascular:. No peripheral edema.  Abdomen: The abdomen is rounded. The abdomen is soft and nontender. No masses are  palpated. No CVA tenderness. No hernias are palpable. No hepatosplenomegaly noted.  Genitourinary:  Chaperone Present: kim lewis.  Examination of the external genitalia shows no vulvar atrophy. The urethra is normal in appearance. There is no urethral mass. Urethral hypermobility is not present. Vaginal exam demonstrates no abnormalities. A cystocele is present with a midline defect (grade 3 /4). No enterocele is identified. No rectocele is identified. The cervix is is absent. The uterus is absent, but non tender. The adnexa are palpably normal. The bladder is normal on palpation.  Lymphatics: The femoral and inguinal nodes are not enlarged or tender.  Skin: Normal skin turgor, no visible rash and no visible skin lesions.  Neuro/Psych:. Mood and affect are appropriate.    Results/Data Urine [Data Includes: Last 1 Day]   [id number]  COLOR YELLOW   APPEARANCE CLEAR   SPECIFIC GRAVITY <1.005   pH 7.0   GLUCOSE NEG mg/dL  BILIRUBIN NEG   KETONE NEG mg/dL  BLOOD NEG   PROTEIN NEG mg/dL  UROBILINOGEN 0.2 mg/dL  NITRITE NEG   LEUKOCYTE ESTERASE NEG    Assessment Assessed  1. Complete Vaginal Cuff Prolapse 2. Renal lesion (N28.9) 3. 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 She was repaired with biologic repair with ACELL. We will re-repair with BS UPHOLD ( mesh) sacrospinous apical repair- 23 hr observation. She is aware of mesh and possibility of mesh  extrusion.    No incontinence, and sling is doing well. No pain. No bleeding. No straining.   Plan Complete Vaginal Cuff Prolapse  1. Pelvic Exam; Status:Complete;   Done: [id number] Health Maintenance  2. UA With REFLEX; [Do Not Release]; Status:Resulted - Requires  Verification;   Done: [id number] 02:20PM Pelvic relaxation due to uterovaginal prolapse, complete  3. Follow-up Schedule Surgery Office  Follow-up  Status: Hold For -   Appointment  Requested for: [id number]  Schedule surgery fo re-repair of vaginal apical prolapse.   Discussion/Summary cc: Dr. Radene Knee   cc: Dr. Romie Levee Electronically signed by : Carolan Clines, M.D.; Dec 15 2014  4:01PM EST

## 2015-02-02 ENCOUNTER — Ambulatory Visit (HOSPITAL_COMMUNITY): Payer: Medicare Other | Admitting: Certified Registered Nurse Anesthetist

## 2015-02-02 ENCOUNTER — Observation Stay (HOSPITAL_COMMUNITY)
Admission: RE | Admit: 2015-02-02 | Discharge: 2015-02-03 | Disposition: A | Discharge status: Home / Self Care | Payer: Medicare Other | Source: Ambulatory Visit | Attending: Urology | Admitting: Urology

## 2015-02-02 ENCOUNTER — Encounter (HOSPITAL_COMMUNITY)
Admission: RE | Disposition: A | Discharge status: Home / Self Care | Payer: Self-pay | Source: Ambulatory Visit | Attending: Urology

## 2015-02-02 ENCOUNTER — Encounter (HOSPITAL_COMMUNITY): Payer: Self-pay | Admitting: *Deleted

## 2015-02-02 DIAGNOSIS — I10 Essential (primary) hypertension: Secondary | ICD-10-CM | POA: Insufficient documentation

## 2015-02-02 DIAGNOSIS — K219 Gastro-esophageal reflux disease without esophagitis: Secondary | ICD-10-CM | POA: Diagnosis not present

## 2015-02-02 DIAGNOSIS — M1990 Unspecified osteoarthritis, unspecified site: Secondary | ICD-10-CM | POA: Insufficient documentation

## 2015-02-02 DIAGNOSIS — N8111 Cystocele, midline: Secondary | ICD-10-CM | POA: Diagnosis present

## 2015-02-02 DIAGNOSIS — N362 Urethral caruncle: Secondary | ICD-10-CM | POA: Diagnosis not present

## 2015-02-02 DIAGNOSIS — I252 Old myocardial infarction: Secondary | ICD-10-CM | POA: Insufficient documentation

## 2015-02-02 DIAGNOSIS — E039 Hypothyroidism, unspecified: Secondary | ICD-10-CM | POA: Insufficient documentation

## 2015-02-02 DIAGNOSIS — I2510 Atherosclerotic heart disease of native coronary artery without angina pectoris: Secondary | ICD-10-CM | POA: Diagnosis not present

## 2015-02-02 DIAGNOSIS — Z79899 Other long term (current) drug therapy: Secondary | ICD-10-CM | POA: Insufficient documentation

## 2015-02-02 DIAGNOSIS — N8110 Cystocele, unspecified: Secondary | ICD-10-CM | POA: Diagnosis present

## 2015-02-02 DIAGNOSIS — Z7982 Long term (current) use of aspirin: Secondary | ICD-10-CM | POA: Insufficient documentation

## 2015-02-02 DIAGNOSIS — R51 Headache: Secondary | ICD-10-CM | POA: Insufficient documentation

## 2015-02-02 DIAGNOSIS — E785 Hyperlipidemia, unspecified: Secondary | ICD-10-CM | POA: Insufficient documentation

## 2015-02-02 DIAGNOSIS — N816 Rectocele: Secondary | ICD-10-CM

## 2015-02-02 DIAGNOSIS — N815 Vaginal enterocele: Secondary | ICD-10-CM | POA: Insufficient documentation

## 2015-02-02 HISTORY — PX: PUBOVAGINAL SLING: SHX1035

## 2015-02-02 LAB — HEMOGLOBIN AND HEMATOCRIT, BLOOD
HEMATOCRIT: 40.7 % (ref 36.0–46.0)
Hemoglobin: 13.3 g/dL (ref 12.0–15.0)

## 2015-02-02 LAB — BASIC METABOLIC PANEL
Anion gap: 5 (ref 5–15)
BUN: 6 mg/dL (ref 6–23)
CALCIUM: 8.2 mg/dL — AB (ref 8.4–10.5)
CO2: 27 mmol/L (ref 19–32)
Chloride: 104 mmol/L (ref 96–112)
Creatinine, Ser: 0.53 mg/dL (ref 0.50–1.10)
GFR, EST NON AFRICAN AMERICAN: 88 mL/min — AB (ref >90)
Glucose, Bld: 143 mg/dL — ABNORMAL HIGH (ref 70–99)
POTASSIUM: 3.3 mmol/L — AB (ref 3.5–5.1)
Sodium: 136 mmol/L (ref 135–145)

## 2015-02-02 SURGERY — CREATION, PUBOVAGINAL SLING
Anesthesia: General

## 2015-02-02 MED ORDER — OVER THE COUNTER MEDICATION: 3.0000 [oz_av] | Freq: Every day | Status: DC

## 2015-02-02 MED ORDER — GENTAMICIN SULFATE 40 MG/ML IJ SOLN
260.0000 mg | INTRAVENOUS | Status: DC
  Filled 2015-02-02: qty 6.5

## 2015-02-02 MED ORDER — ASPIRIN 81 MG PO CHEW
81.0000 mg | CHEWABLE_TABLET | ORAL | Status: DC
  Administered 2015-02-02: 81 mg via ORAL
  Filled 2015-02-02 (×2): qty 1

## 2015-02-02 MED ORDER — FENTANYL CITRATE (PF) 100 MCG/2ML IJ SOLN
INTRAMUSCULAR | Status: AC
  Filled 2015-02-02: qty 2

## 2015-02-02 MED ORDER — PROMETHAZINE HCL 25 MG/ML IJ SOLN: 6.2500 mg | INTRAMUSCULAR | Status: DC | PRN

## 2015-02-02 MED ORDER — SIMETHICONE 80 MG PO CHEW: 80.0000 mg | CHEWABLE_TABLET | Freq: Three times a day (TID) | ORAL | Status: DC | PRN

## 2015-02-02 MED ORDER — BELLADONNA ALKALOIDS-OPIUM 16.2-60 MG RE SUPP
RECTAL | Status: AC
  Filled 2015-02-02: qty 1

## 2015-02-02 MED ORDER — ROCURONIUM BROMIDE 100 MG/10ML IV SOLN
INTRAVENOUS | Status: DC | PRN
  Administered 2015-02-02: 5 mg via INTRAVENOUS
  Administered 2015-02-02: 10 mg via INTRAVENOUS
  Administered 2015-02-02: 35 mg via INTRAVENOUS

## 2015-02-02 MED ORDER — SODIUM CHLORIDE 0.9 % IJ SOLN
INTRAMUSCULAR | Status: AC
  Filled 2015-02-02: qty 40

## 2015-02-02 MED ORDER — EPHEDRINE SULFATE 50 MG/ML IJ SOLN
INTRAMUSCULAR | Status: DC | PRN
  Administered 2015-02-02 (×4): 10 mg via INTRAVENOUS

## 2015-02-02 MED ORDER — SUCCINYLCHOLINE CHLORIDE 20 MG/ML IJ SOLN
INTRAMUSCULAR | Status: DC | PRN
  Administered 2015-02-02: 100 mg via INTRAVENOUS

## 2015-02-02 MED ORDER — EPHEDRINE SULFATE 50 MG/ML IJ SOLN
INTRAMUSCULAR | Status: AC
  Filled 2015-02-02: qty 1

## 2015-02-02 MED ORDER — PROPOFOL 10 MG/ML IV BOLUS
INTRAVENOUS | Status: DC | PRN
  Administered 2015-02-02: 150 mg via INTRAVENOUS

## 2015-02-02 MED ORDER — POLYVINYL ALCOHOL 1.4 % OP SOLN
1.0000 [drp] | Freq: Every evening | OPHTHALMIC | Status: DC | PRN
  Filled 2015-02-02: qty 15

## 2015-02-02 MED ORDER — SUGAMMADEX SODIUM 200 MG/2ML IV SOLN
2.0000 mg/kg | Freq: Once | INTRAVENOUS | Status: AC
  Administered 2015-02-02: 200 mg via INTRAVENOUS
  Filled 2015-02-02: qty 1.2

## 2015-02-02 MED ORDER — DIPHENHYDRAMINE HCL 12.5 MG/5ML PO ELIX: 12.5000 mg | ORAL_SOLUTION | Freq: Four times a day (QID) | ORAL | Status: DC | PRN

## 2015-02-02 MED ORDER — MEPERIDINE HCL 50 MG/ML IJ SOLN: 6.2500 mg | INTRAMUSCULAR | Status: DC | PRN

## 2015-02-02 MED ORDER — POLYETHYLENE GLYCOL 3350 17 G PO PACK
8.5000 g | PACK | Freq: Every day | ORAL | Status: DC
  Administered 2015-02-02: 8.5 g via ORAL
  Filled 2015-02-02: qty 1

## 2015-02-02 MED ORDER — ONDANSETRON HCL 4 MG/2ML IJ SOLN: 4.0000 mg | INTRAMUSCULAR | Status: DC | PRN

## 2015-02-02 MED ORDER — DIPHENHYDRAMINE HCL 50 MG/ML IJ SOLN: 12.5000 mg | Freq: Four times a day (QID) | INTRAMUSCULAR | Status: DC | PRN

## 2015-02-02 MED ORDER — DICYCLOMINE HCL 10 MG PO CAPS
10.0000 mg | ORAL_CAPSULE | Freq: Every morning | ORAL | Status: DC
  Administered 2015-02-02 – 2015-02-03 (×2): 10 mg via ORAL
  Filled 2015-02-02 (×2): qty 1

## 2015-02-02 MED ORDER — LACTATED RINGERS IV SOLN: INTRAVENOUS | Status: DC

## 2015-02-02 MED ORDER — GENTAMICIN SULFATE 40 MG/ML IJ SOLN
5.0000 mg/kg | Freq: Once | INTRAVENOUS | Status: DC
  Filled 2015-02-02: qty 6.5

## 2015-02-02 MED ORDER — IRBESARTAN 75 MG PO TABS
37.5000 mg | ORAL_TABLET | Freq: Every day | ORAL | Status: DC
  Administered 2015-02-03: 37.5 mg via ORAL
  Filled 2015-02-02 (×2): qty 0.5

## 2015-02-02 MED ORDER — ONDANSETRON HCL 4 MG/2ML IJ SOLN
INTRAMUSCULAR | Status: DC | PRN
Start: 2015-02-02 — End: 2015-02-02
  Administered 2015-02-02: 4 mg via INTRAVENOUS

## 2015-02-02 MED ORDER — BUPIVACAINE-EPINEPHRINE (PF) 0.25% -1:200000 IJ SOLN
INTRAMUSCULAR | Status: AC
  Filled 2015-02-02: qty 30

## 2015-02-02 MED ORDER — LIDOCAINE HCL (CARDIAC) 20 MG/ML IV SOLN
INTRAVENOUS | Status: AC
  Filled 2015-02-02: qty 5

## 2015-02-02 MED ORDER — 0.9 % SODIUM CHLORIDE (POUR BTL) OPTIME
TOPICAL | Status: DC | PRN
  Administered 2015-02-02: 1000 mL

## 2015-02-02 MED ORDER — KETOROLAC TROMETHAMINE 30 MG/ML IJ SOLN
INTRAMUSCULAR | Status: AC
  Filled 2015-02-02: qty 1

## 2015-02-02 MED ORDER — LEVOTHYROXINE SODIUM 75 MCG PO TABS
75.0000 ug | ORAL_TABLET | Freq: Every day | ORAL | Status: DC
  Administered 2015-02-03: 75 ug via ORAL
  Filled 2015-02-02 (×2): qty 3
  Filled 2015-02-02 (×2): qty 1

## 2015-02-02 MED ORDER — BUPIVACAINE-EPINEPHRINE (PF) 0.25% -1:200000 IJ SOLN
INTRAMUSCULAR | Status: DC | PRN
  Administered 2015-02-02 (×2): 30 mL

## 2015-02-02 MED ORDER — ESTRADIOL 0.1 MG/GM VA CREA
TOPICAL_CREAM | VAGINAL | Status: AC
  Filled 2015-02-02: qty 42.5

## 2015-02-02 MED ORDER — CALCITONIN (SALMON) 200 UNIT/ACT NA SOLN
1.0000 | Freq: Every day | NASAL | Status: DC
  Filled 2015-02-02: qty 3.7

## 2015-02-02 MED ORDER — FENTANYL CITRATE (PF) 250 MCG/5ML IJ SOLN
INTRAMUSCULAR | Status: AC
  Filled 2015-02-02: qty 5

## 2015-02-02 MED ORDER — AMLODIPINE BESYLATE 5 MG PO TABS: 5.0000 mg | ORAL_TABLET | Freq: Every evening | ORAL | Status: DC

## 2015-02-02 MED ORDER — BACITRACIN-NEOMYCIN-POLYMYXIN 400-5-5000 EX OINT: 1.0000 "application " | TOPICAL_OINTMENT | Freq: Three times a day (TID) | CUTANEOUS | Status: DC | PRN

## 2015-02-02 MED ORDER — METHYLENE BLUE 1 % INJ SOLN
INTRAMUSCULAR | Status: DC | PRN
  Administered 2015-02-02 (×2): 5 mL via INTRAVENOUS

## 2015-02-02 MED ORDER — BUPIVACAINE-EPINEPHRINE (PF) 0.25% -1:200000 IJ SOLN
INTRAMUSCULAR | Status: AC
  Filled 2015-02-02: qty 60

## 2015-02-02 MED ORDER — FENTANYL CITRATE (PF) 100 MCG/2ML IJ SOLN
INTRAMUSCULAR | Status: DC | PRN
  Administered 2015-02-02 (×5): 50 ug via INTRAVENOUS

## 2015-02-02 MED ORDER — ATORVASTATIN CALCIUM 10 MG PO TABS
5.0000 mg | ORAL_TABLET | Freq: Every evening | ORAL | Status: DC
  Administered 2015-02-02: 5 mg via ORAL
  Filled 2015-02-02: qty 1

## 2015-02-02 MED ORDER — CYCLOSPORINE 0.05 % OP EMUL
1.0000 [drp] | Freq: Two times a day (BID) | OPHTHALMIC | Status: DC
  Administered 2015-02-02 – 2015-02-03 (×2): 1 [drp] via OPHTHALMIC
  Filled 2015-02-02 (×3): qty 1

## 2015-02-02 MED ORDER — TRAMADOL-ACETAMINOPHEN 37.5-325 MG PO TABS
2.0000 | ORAL_TABLET | Freq: Four times a day (QID) | ORAL | Status: DC | PRN
  Administered 2015-02-02: 1 via ORAL
  Administered 2015-02-02: 2 via ORAL
  Filled 2015-02-02: qty 2
  Filled 2015-02-02: qty 1

## 2015-02-02 MED ORDER — PROPOFOL 10 MG/ML IV BOLUS
INTRAVENOUS | Status: AC
  Filled 2015-02-02: qty 20

## 2015-02-02 MED ORDER — ACETAMINOPHEN 10 MG/ML IV SOLN
1000.0000 mg | Freq: Once | INTRAVENOUS | Status: AC
  Administered 2015-02-02: 1000 mg via INTRAVENOUS
  Filled 2015-02-02: qty 100

## 2015-02-02 MED ORDER — CALCIUM CITRATE-VITAMIN D 315-200 MG-UNIT PO TABS: 2.0000 | ORAL_TABLET | Freq: Two times a day (BID) | ORAL | Status: DC

## 2015-02-02 MED ORDER — FENTANYL CITRATE (PF) 100 MCG/2ML IJ SOLN: 25.0000 ug | INTRAMUSCULAR | Status: DC | PRN

## 2015-02-02 MED ORDER — ESTRADIOL 0.1 MG/GM VA CREA
TOPICAL_CREAM | VAGINAL | Status: DC | PRN
  Administered 2015-02-02: 1 via VAGINAL

## 2015-02-02 MED ORDER — ROCURONIUM BROMIDE 100 MG/10ML IV SOLN
INTRAVENOUS | Status: AC
  Filled 2015-02-02: qty 1

## 2015-02-02 MED ORDER — ACETAMINOPHEN 500 MG PO TABS
1000.0000 mg | ORAL_TABLET | Freq: Four times a day (QID) | ORAL | Status: DC
  Administered 2015-02-03: 1000 mg via ORAL
  Filled 2015-02-02 (×3): qty 2

## 2015-02-02 MED ORDER — LIDOCAINE HCL (CARDIAC) 20 MG/ML IV SOLN
INTRAVENOUS | Status: DC | PRN
  Administered 2015-02-02: 100 mg via INTRAVENOUS

## 2015-02-02 MED ORDER — LACTATED RINGERS IV SOLN
INTRAVENOUS | Status: DC | PRN
  Administered 2015-02-02 (×3): via INTRAVENOUS

## 2015-02-02 MED ORDER — SODIUM CHLORIDE 0.45 % IV SOLN
INTRAVENOUS | Status: DC
  Administered 2015-02-02 – 2015-02-03 (×2): via INTRAVENOUS

## 2015-02-02 MED ORDER — PANTOPRAZOLE SODIUM 20 MG PO TBEC
20.0000 mg | DELAYED_RELEASE_TABLET | Freq: Every day | ORAL | Status: DC
  Administered 2015-02-03: 20 mg via ORAL
  Filled 2015-02-02: qty 1

## 2015-02-02 MED ORDER — DEXAMETHASONE SODIUM PHOSPHATE 10 MG/ML IJ SOLN
INTRAMUSCULAR | Status: AC
  Filled 2015-02-02: qty 1

## 2015-02-02 MED ORDER — DIAZEPAM 5 MG PO TABS: 2.5000 mg | ORAL_TABLET | Freq: Once | ORAL | Status: AC | PRN

## 2015-02-02 MED ORDER — NEBIVOLOL HCL 2.5 MG PO TABS
2.5000 mg | ORAL_TABLET | Freq: Every morning | ORAL | Status: DC
  Administered 2015-02-03: 2.5 mg via ORAL
  Filled 2015-02-02: qty 1

## 2015-02-02 MED ORDER — CALCIUM CARBONATE-VITAMIN D 500-200 MG-UNIT PO TABS
1.0000 | ORAL_TABLET | Freq: Two times a day (BID) | ORAL | Status: DC
  Administered 2015-02-03: 1 via ORAL
  Filled 2015-02-02 (×2): qty 1

## 2015-02-02 MED ORDER — FLUTICASONE PROPIONATE 50 MCG/ACT NA SUSP: 2.0000 | Freq: Every day | NASAL | Status: DC | PRN

## 2015-02-02 MED ORDER — ACETAMINOPHEN 500 MG PO TABS
500.0000 mg | ORAL_TABLET | Freq: Four times a day (QID) | ORAL | Status: DC | PRN
  Administered 2015-02-02: 500 mg via ORAL

## 2015-02-02 MED ORDER — SODIUM CHLORIDE 0.9 % IJ SOLN
INTRAMUSCULAR | Status: AC
  Filled 2015-02-02: qty 10

## 2015-02-02 MED ORDER — SODIUM CHLORIDE 0.9 % IJ SOLN
INTRAMUSCULAR | Status: DC | PRN
  Administered 2015-02-02: 40 mL

## 2015-02-02 MED ORDER — BUPIVACAINE-EPINEPHRINE 0.25% -1:200000 IJ SOLN
INTRAMUSCULAR | Status: AC
  Filled 2015-02-02: qty 1

## 2015-02-02 MED ORDER — DEXAMETHASONE SODIUM PHOSPHATE 10 MG/ML IJ SOLN
INTRAMUSCULAR | Status: DC | PRN
  Administered 2015-02-02: 10 mg via INTRAVENOUS

## 2015-02-02 MED ORDER — SODIUM CHLORIDE 0.9 % IR SOLN
Status: DC | PRN
  Administered 2015-02-02: 500 mL

## 2015-02-02 MED ORDER — METHYLENE BLUE 1 % INJ SOLN
INTRAMUSCULAR | Status: AC
  Filled 2015-02-02: qty 10

## 2015-02-02 MED ORDER — ONDANSETRON HCL 4 MG/2ML IJ SOLN
INTRAMUSCULAR | Status: AC
  Filled 2015-02-02: qty 2

## 2015-02-02 MED ORDER — OXYCODONE-ACETAMINOPHEN 5-325 MG PO TABS: 1.0000 | ORAL_TABLET | ORAL | Status: DC | PRN

## 2015-02-02 MED ORDER — SODIUM CHLORIDE 0.9 % IR SOLN
Status: AC
  Filled 2015-02-02: qty 1

## 2015-02-02 MED ORDER — CARBOXYMETHYLCELL-HYPROMELLOSE 0.25-0.3 % OP GEL: 1.0000 [drp] | Freq: Every evening | OPHTHALMIC | Status: DC | PRN

## 2015-02-02 SURGICAL SUPPLY — 45 items
BAG DECANTER FOR FLEXI CONT (MISCELLANEOUS) ×1 IMPLANT
BAG URINE DRAINAGE (UROLOGICAL SUPPLIES) ×2 IMPLANT
BLADE HEX COATED 2.75 (ELECTRODE) IMPLANT
BLADE SURG 15 STRL LF DISP TIS (BLADE) ×1 IMPLANT
BLADE SURG 15 STRL SS (BLADE) ×3
BLADE SURG SZ10 CARB STEEL (BLADE) ×1 IMPLANT
CATH FOLEY 2WAY SLVR  5CC 18FR (CATHETERS)
CATH FOLEY 2WAY SLVR 5CC 18FR (CATHETERS) ×1 IMPLANT
COVER SURGICAL LIGHT HANDLE (MISCELLANEOUS) ×3 IMPLANT
DECANTER SPIKE VIAL GLASS SM (MISCELLANEOUS) ×3 IMPLANT
DRAIN PENROSE 18X1/2 LTX STRL (DRAIN) ×1 IMPLANT
DRAPE CAMERA CLOSED 9X96 (DRAPES) ×1 IMPLANT
DRAPE UTILITY 15X26 (DRAPE) ×3 IMPLANT
ELECT REM PT RETURN 9FT ADLT (ELECTROSURGICAL)
ELECTRODE REM PT RTRN 9FT ADLT (ELECTROSURGICAL) IMPLANT
GLOVE BIOGEL M STRL SZ7.5 (GLOVE) ×9 IMPLANT
GOWN STRL REUS W/TWL XL LVL3 (GOWN DISPOSABLE) ×6 IMPLANT
KIT BASIN OR (CUSTOM PROCEDURE TRAY) ×3 IMPLANT
KIT SUPRAPUBIC CATH (MISCELLANEOUS) IMPLANT
LIQUID BAND (GAUZE/BANDAGES/DRESSINGS) IMPLANT
NEEDLE HYPO 22GX1.5 SAFETY (NEEDLE) ×3 IMPLANT
NS IRRIG 1000ML POUR BTL (IV SOLUTION) IMPLANT
PACK CYSTO (CUSTOM PROCEDURE TRAY) ×3 IMPLANT
PACKING VAGINAL (PACKING) ×2 IMPLANT
PENCIL BUTTON HOLSTER BLD 10FT (ELECTRODE) ×2 IMPLANT
PLUG CATH AND CAP STER (CATHETERS) ×3 IMPLANT
RETRACTOR LONRSTAR 16.6X16.6CM (MISCELLANEOUS) ×1 IMPLANT
RETRACTOR STAY HOOK 5MM (MISCELLANEOUS) ×3 IMPLANT
RETRACTOR STER APS 16.6X16.6CM (MISCELLANEOUS) ×3
SCRUB PCMX 4 OZ (MISCELLANEOUS) ×3 IMPLANT
SHEET LAVH (DRAPES) ×3 IMPLANT
SPONGE LAP 4X18 X RAY DECT (DISPOSABLE) ×9 IMPLANT
SUCTION FRAZIER 12FR DISP (SUCTIONS) ×3 IMPLANT
SURGIFLO W/THROMBIN 8M KIT (HEMOSTASIS) ×2 IMPLANT
SUT VIC AB 2-0 UR5 27 (SUTURE) ×3 IMPLANT
SUT VIC AB 2-0 UR6 27 (SUTURE) ×32 IMPLANT
SYRINGE 10CC LL (SYRINGE) ×3 IMPLANT
SYS PRF KIT VAG SUP UPHOLD (Sling) ×2 IMPLANT
TISSUE REPAIR XENFORM 4X7CM (Tissue) ×2 IMPLANT
TOWEL OR NON WOVEN STRL DISP B (DISPOSABLE) ×3 IMPLANT
TUBING CONNECTING 10 (TUBING) ×2 IMPLANT
TUBING CONNECTING 10' (TUBING) ×1
WATER STERILE IRR 1500ML POUR (IV SOLUTION) IMPLANT
WATER STERILE IRR 500ML POUR (IV SOLUTION) ×2 IMPLANT
YANKAUER SUCT BULB TIP 10FT TU (MISCELLANEOUS) ×3 IMPLANT

## 2015-02-02 NOTE — Op Note (Signed)
Pre-operative diagnosis : Recurrent anterior vaginal vault prolapse     Postoperative diagnosis:  Same plus enterocele  Operation: Repair of recurrent anterior vault prolapse with Kelly plication, Augmented with Anterior vault mesh suspension and apical vault suspension using [employer] mesh support system; and Augmented enterocele repair using [employer] tissue, excision of urethral caruncle..  Surgeon:  S. Gaynelle Arabian, MD  First assistant:  Orest Dikes. ORRN-FA Student  Anesthesia:  General LMA  Preparation:  Appropriate preanesthesia, the patient was brought to the operating, placed on the operating table in dorsal supine position where general LMA anesthesia was introduced. She was then replaced in the dorsal lithotomy position with pubis was prepped with Betadine solution and draped in usual fashion. The arm band was double checked. The history was double checked. Foley cath was placed. The [place] retractor was placed in usual fashion.  Review history:  Complete Vaginal Cuff Prolapse  Assessed By: Carolan Clines (Urology); Last Assessed: 15 Dec 2014 2. Renal lesion (N28.9)  Assessed By: Carolan Clines (Urology); Last Assessed: 15 Dec 2014  History of Present Illness   81 yo female returns today for a 4 mo f/u & to review MRI results for hx of Lt renal lesion. She is s/p cysto/bil RPG/bil JJ stents/Anterior repair with Kelly plication, ACell implant/Lynx sling and hysterectomy by Dr. Radene Knee on 07/14/14. Pt felt that she started to fail in December, and feels that bladder is beginning to fall down. She has had severe bronchitis ( "worst case I ever had").     Originally referred by Dr. Radene Knee for evaluation of pelvic floor prolapse and urinary incontinence. The patient has worsening cystocele, with uterine descent over the last 10 years. 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 She has had attempts at  pessary placement, but they will not stay in place. She is para 6-5-1.   Statement of  Likelihood of Success: Excellent. TIME-OUT observed.:  Procedure:  Vaginal inspection in the dorsal lithotomy position with Foley catheter in place reveals the patient has a stage III recurrent anterior vault prolapse. The urethra appears to be in normal mid position without hypermobility. There is no evidence of any urethral mesh exposure. There is a urethral carbuncle present, and this is pedunculated, and prolapsing. This is removed using the elective surgical unit. There is no bleeding noted.  Using a blue marking pen, outline is made of a semilunar anterior vault incision, proximal leg 2 cm proximal to the urethrovaginal junction. This incision is just distal to the prior well-healed incision. 60 mL of mixture of Marcaine 0.25% and epinephrine 1-200,000 was then injected throughout the anterior vault, in order to hydrodissect the tissue, and produce postoperative analgesia. A 6 cm horizontal incision was then made across the vaginal vault, subcutaneous tissue is dissected. The anterior vault dissection was slow and tedious, and great care was taken to avoid any injury to the vaginal tissue, and to the bladder underneath. Minimal bleeding was noted. Following dissection of the vaginal vault, Kelly plication was accomplished, using 2-0 Vicryl horizontal mattress sutures. Following this, dissection was accomplished into the pelvic floor, using the ischial spines as landmarks. Following dissection of the ischial spines, the sacrospinous ligaments were dissected. Scarring was noted in the fossa, and on the sacrospinous ligaments from previous sacrospinous fixation.  The limbs of the Uphold Lite mesh were then placed through the left and right sacrospinous ligaments using the Cappio device without difficulty. Placement was double checked to ensure  proper positioning one finger breath medial to the ischial spines  bilaterally. The self dilating sutures were brought through the ligaments, and the mesh lay flat against the bladder. The mesh was sutured in placed with 3 separate 2-0 Vicryl sutures both proximally, and distalward. The limbs were then brought forward, and the sutures incised, and the plastic sleeves removed. The mesh was smooth against the anterior bladder wall. No bleeding was noted. A small amount of anterior vaginal wall was removed, and the remaining anterior vaginal wall was closed with running 2-0 Vicryl suture.  Cystoscopy revealed ureteral orifices, with urine production. However, despite methylene blue having been given, 5 mL, no blue urine was identified. Repeat cystoscopy at the end of the next portion of the case was also, as, but again, no blue urine was identified. Again, ureteral orifices were identified.  Vaginal inspection after anterior vault repair shows that the patient had an enterocele. I felt that this needed repair. Therefore, I outlined a 4 cm vertical incision over the enterocele. 8 mL of local anesthetic was then injected in the mucosa for hydrodissection, and postop analgesia. A 4 cm incision is made and subcutaneous tissue is dissected. The previously placed mesh could be identified. I was able to completely dissected the enterocele sac, and using 2-0 Vicryl suture, was able to completely envelop the enterocele sac. I was able to suture the enterocele sac closed, and then sutured to the side walls, so that as the mesh advances, it will draw the apex with it. I then placed the augmented Xenform tissue, measuring 4 cm x 3 cm in the wound, and sutured the left side with interrupted 2-0 Vicryl suture. Some troublesome venous bleeding was noted from the right side, which did not stop with packing. I therefore placed FloSeal, or its equivalent in the wound, and left packed for 3 minutes. I then sutured the remaining Xenform in place with interrupted 2-0 Vicryl suture. No bleeding was  then noted. The edges of the posterior vaginal wall were freshened by removing some tissue, and the wound was closed with a running 2-0 Vicryl suture. Estrogen-soaked vaginal packing was placed. Repeat cystoscopy again revealed ureteral orifices, but again, no blue urine was identified, despite entire 10 mL of methylene blue having been given. It is thought that the methylene blue was broken down. The patient will have labs in the recovery room, and possibly renal ultrasound is necessary.  The patient tolerated sure well. She was awakened, with Foley catheter straight drainage, and taken to recovery room in excellent condition.

## 2015-02-02 NOTE — Progress Notes (Signed)
Pt became lightheaded/dizzy/pale when stood up to get into chair. Pt noted to be hypotensive 68/38, all other VSS. Pt assisted back to bed, placed into Trendelenberg and O2 applied. Pt's color returned and dizziness resolved. No LOC. Pt not resting comfortably in bed, talking w/ family. Will monitor.

## 2015-02-02 NOTE — Progress Notes (Signed)
Urology Progress Note  Day of Surgery   Subjective: Post op ck: post: 1. Excision caruncle                                                     2. Anterior vault augmented repair with  Mesh sacrospinous fixation;                                                     3. Enterocele augmented repair with Xenform tissue.    Pt lightheaded upon standing tonight with hypotensive episode. Rx fluids.  Urine green 2ndary intra op meds.   No acute urologic events overnight. Ambulation:   negative Flatus:    negative Bowel movement  negative  Pain: complete resolution  Objective:  Blood pressure 109/56, pulse 88, temperature 97.4 F (36.3 C), temperature source Oral, resp. rate 18, height 5\' 1"  (1.549 m), weight 60.102 kg (132 lb 8 oz), SpO2 100 %.  Physical Exam:  General:  No acute distress, awake Genitourinary:  Normal BUS Foley:  Straight drain       Recent Labs     02/02/15  1303  HGB  13.3    Recent Labs     02/02/15  1303  NA  136  K  3.3*  CL  104  CO2  27  BUN  6  CREATININE  0.53  CALCIUM  8.2*  GFRNONAA  88*  GFRAA  >90     No results for input(s): INR, APTT in the last 72 hours.  Invalid input(s): PT   Invalid input(s): ABG  Assessment/Plan:  Catheter not removed.

## 2015-02-02 NOTE — Progress Notes (Signed)
Dr Gaynelle Arabian at bedside w/ pt and husband. Made aware of pt's hypotensive episode earlier. No new orders.

## 2015-02-02 NOTE — Anesthesia Preprocedure Evaluation (Signed)
Anesthesia Evaluation  Patient identified by MRN, date of birth, ID band Patient awake    Reviewed: Allergy & Precautions, H&P , NPO status , Patient's Chart, lab work & pertinent test results  History of Anesthesia Complications (+) DIFFICULT AIRWAY  Airway Mallampati: III  TM Distance: >3 FB Neck ROM: Limited    Dental  (+) Dental Advisory Given, Teeth Intact   Pulmonary pneumonia -, resolved,  breath sounds clear to auscultation  Pulmonary exam normal       Cardiovascular Exercise Tolerance: Good hypertension, + CAD and + Past MI Rhythm:Regular Rate:Normal  Old MI on ECG   Neuro/Psych  Headaches, Cervical fusion negative psych ROS   GI/Hepatic Neg liver ROS, GERD-  Medicated,  Endo/Other  Hypothyroidism   Renal/GU negative Renal ROS     Musculoskeletal negative musculoskeletal ROS (+)   Abdominal   Peds  Hematology negative hematology ROS (+)   Anesthesia Other Findings   Reproductive/Obstetrics                             Anesthesia Physical  Anesthesia Plan  ASA: III  Anesthesia Plan: General   Post-op Pain Management:    Induction: Intravenous  Airway Management Planned: Oral ETT and Video Laryngoscope Planned  Additional Equipment:   Intra-op Plan:   Post-operative Plan: Extubation in OR  Informed Consent:   Plan Discussed with: Surgeon  Anesthesia Plan Comments:         Anesthesia Quick Evaluation

## 2015-02-02 NOTE — Interval H&P Note (Signed)
History and Physical Interval Note:  02/02/2015 8:39 AM  Sherry Olsen  has presented today for surgery, with the diagnosis of anterior vault prolapse  The various methods of treatment have been discussed with the patient and family. After consideration of risks, benefits and other options for treatment, the patient has consented to  Procedure(s): UPHOLD LITE WITH SACROSPINOUS [place] (N/A) as a surgical intervention .  The patient's history has been reviewed, patient examined, no change in status, stable for surgery.  I have reviewed the patient's chart and labs.  Questions were answered to the patient's satisfaction.     Caedyn Raygoza I Pearlie Lafosse

## 2015-02-02 NOTE — Anesthesia Procedure Notes (Addendum)
Procedure Name: Intubation Date/Time: 02/02/2015 9:45 AM Performed by: Maxwell Caul Pre-anesthesia Checklist: Patient identified, Emergency Drugs available, Suction available and Patient being monitored Patient Re-evaluated:Patient Re-evaluated prior to inductionOxygen Delivery Method: Circle system utilized Preoxygenation: Pre-oxygenation with 100% oxygen Intubation Type: IV induction Ventilation: Mask ventilation without difficulty Laryngoscope Size: Glidescope and 3 Grade View: Grade I Tube type: Oral Tube size: 7.0 mm Number of attempts: 1 Airway Equipment and Method: Video-laryngoscopy and Stylet Placement Confirmation: ETT inserted through vocal cords under direct vision,  positive ETCO2,  CO2 detector and breath sounds checked- equal and bilateral Secured at: 20 cm Tube secured with: Tape Dental Injury: Teeth and Oropharynx as per pre-operative assessment  Difficulty Due To: Difficulty was anticipated and Difficult Airway-  due to neck instability Future Recommendations: Recommend- induction with short-acting agent, and alternative techniques readily available Comments: Elective Glidescope intubation due to prior anesthesia records. Grade 1 view and ETT placed with ease.

## 2015-02-02 NOTE — Interval H&P Note (Signed)
History and Physical Interval Note:  02/02/2015 8:34 AM  Sherry Olsen  has presented today for surgery, with the diagnosis of anterior vault prolapse  The various methods of treatment have been discussed with the patient and family. After consideration of risks, benefits and other options for treatment, the patient has consented to  Procedure(s): UPHOLD LITE WITH SACROSPINOUS [place] (N/A) as a surgical intervention .  The patient's history has been reviewed, patient examined, no change in status, stable for surgery.  I have reviewed the patient's chart and labs.  Questions were answered to the patient's satisfaction.     Evangelos Paulino I Matia Zelada

## 2015-02-02 NOTE — Anesthesia Postprocedure Evaluation (Signed)
  Anesthesia Post-op Note  Patient: Sherry Olsen  Procedure(s) Performed: Procedure(s) (LRB): EXCISION OF URETHRAL CARUNCLE, ANTERIOR REPAIR, UPHOLD LITE WITH SACROSPINOUS FIXATION [employer]  MESH PELVIC FLOOR APICAL REPAIR , KELLY PLICATION, CYSTOSCOPY, AUGMENTED ENTEROCELE REPAIR WITH [employer] XENFORM (N/A)  Patient Location: PACU  Anesthesia Type: General  Level of Consciousness: awake and alert   Airway and Oxygen Therapy: Patient Spontanous Breathing  Post-op Pain: mild  Post-op Assessment: Post-op Vital signs reviewed, Patient's Cardiovascular Status Stable, Respiratory Function Stable, Patent Airway and No signs of Nausea or vomiting  Last Vitals:  Filed Vitals:   02/02/15 1330  BP: 117/63  Pulse: 87  Temp: 36.3 C  Resp:     Post-op Vital Signs: stable   Complications: No apparent anesthesia complications

## 2015-02-02 NOTE — Transfer of Care (Signed)
Immediate Anesthesia Transfer of Care Note  Patient: Sherry Olsen  Procedure(s) Performed: Procedure(s): EXCISION OF URETHRAL CARUNCLE, ANTERIOR REPAIR, UPHOLD LITE WITH SACROSPINOUS FIXATION [employer]  MESH PELVIC FLOOR APICAL REPAIR , KELLY PLICATION, CYSTOSCOPY, AUGMENTED ENTEROCELE REPAIR WITH [employer] XENFORM (N/A)  Patient Location: PACU  Anesthesia Type:General  Level of Consciousness:  sedated, patient cooperative and responds to stimulation  Airway & Oxygen Therapy:Patient Spontanous Breathing and Patient connected to face mask oxgen  Post-op Assessment:  Report given to PACU RN and Post -op Vital signs reviewed and stable  Post vital signs:  Reviewed and stable  Last Vitals:  Filed Vitals:   02/02/15 1233  BP: 133/63  Pulse: 93  Temp:   Resp: 17    Complications: No apparent anesthesia complications

## 2015-02-02 NOTE — Progress Notes (Signed)
Pt arrived to unit on stretcher, slid self to bed. VSS, pt A&Ox4, states pain 1/10 at present. Pt and husband oriented to callbell and environment. POC reviewed. Vaginal packing in place. Callbell and bedside table in reach.

## 2015-02-03 ENCOUNTER — Encounter (HOSPITAL_COMMUNITY): Payer: Self-pay | Admitting: Urology

## 2015-02-03 DIAGNOSIS — N8111 Cystocele, midline: Secondary | ICD-10-CM | POA: Diagnosis not present

## 2015-02-03 LAB — BASIC METABOLIC PANEL
Anion gap: 4 — ABNORMAL LOW (ref 5–15)
BUN: 6 mg/dL (ref 6–23)
CALCIUM: 8.8 mg/dL (ref 8.4–10.5)
CO2: 27 mmol/L (ref 19–32)
Chloride: 103 mmol/L (ref 96–112)
Creatinine, Ser: 0.51 mg/dL (ref 0.50–1.10)
GFR calc Af Amer: >90 mL/min (ref >90)
GFR calc non Af Amer: 89 mL/min — ABNORMAL LOW (ref >90)
Glucose, Bld: 113 mg/dL — ABNORMAL HIGH (ref 70–99)
Potassium: 4.5 mmol/L (ref 3.5–5.1)
Sodium: 134 mmol/L — ABNORMAL LOW (ref 135–145)

## 2015-02-03 MED ORDER — TRAMADOL-ACETAMINOPHEN 37.5-325 MG PO TABS: 1.0000 | ORAL_TABLET | Freq: Four times a day (QID) | ORAL | Status: DC | PRN

## 2015-02-03 NOTE — Discharge Summary (Signed)
Physician Discharge Summary  Patient ID: Sherry Olsen MRN: [id number] DOB/AGE: 10/26/1932 81 y.o.  Admit date: 02/02/2015 Discharge date: 02/03/2015  Admission Diagnoses: anterior vault prolapse  Discharge Diagnoses:  Stage III recurrent anterior vault pelvic organ prolapse with associated enterocele.  Discharged Condition: improved/strble  Hospital Course: Surgery 01/23/2015  Significant Diagnostic Studies: Creatinine 0.89 Discharge Exam: Blood pressure 116/51, pulse 73, temperature 97.7 F (36.5 C), temperature source Oral, resp. rate 16, height 5\' 1"  (1.549 m), weight 60.102 kg (132 lb 8 oz), SpO2 100 %.   Disposition: 01-Home or Self Care  Discharge Instructions    Discharge patient    Complete by:  As directed      Discontinue IV    Complete by:  As directed             Medication List    TAKE these medications        acetaminophen 500 MG tablet  Commonly known as:  TYLENOL  Take 500 mg by mouth every 6 (six) hours as needed for moderate pain. For pain     amLODipine 5 MG tablet  Commonly known as:  NORVASC  Take 5 mg by mouth every evening.     aspirin 81 MG chewable tablet  Chew 81 mg by mouth every other day. At night.     atorvastatin 10 MG tablet  Commonly known as:  LIPITOR  Take 5 mg by mouth every evening.     BENICAR 20 MG tablet  Generic drug:  olmesartan  Take 20 mg by mouth every evening.     BENTYL 10 MG capsule  Generic drug:  dicyclomine  Take 10 mg by mouth every morning.     calcitonin (salmon) 200 UNIT/ACT nasal spray  Commonly known as:  MIACALCIN/FORTICAL  Place 1 spray into the nose every morning.     calcium citrate-vitamin D 315-200 MG-UNIT per tablet  Commonly known as:  CITRACAL+D  Take 2 tablets by mouth 2 (two) times daily.     diazepam 5 MG tablet  Commonly known as:  VALIUM  Take 2.5 mg by mouth once as needed for anxiety.     fluticasone 50 MCG/ACT nasal spray  Commonly known as:  FLONASE  Place 2 sprays into  both nostrils daily as needed for allergies.     GAS-X EXTRA STRENGTH 125 MG Caps  Generic drug:  Simethicone  Take 1 capsule by mouth 3 (three) times daily as needed (indigestion.).     GENTEAL 0.25-0.3 % Gel  Generic drug:  Carboxymethylcell-Hypromellose  Apply 1 drop to eye at bedtime as needed (dry eyes).     lansoprazole 30 MG capsule  Commonly known as:  PREVACID  Take 30 mg by mouth every morning.     levothyroxine 75 MCG tablet  Commonly known as:  SYNTHROID, LEVOTHROID  Take 75 mcg by mouth daily before breakfast.     nebivolol 5 MG tablet  Commonly known as:  BYSTOLIC  Take 2.5 mg by mouth every morning.     OVER THE COUNTER MEDICATION  Take 3 oz by mouth at bedtime. Prune juice.     polyethylene glycol packet  Commonly known as:  MIRALAX / GLYCOLAX  Take 8.5 g by mouth at bedtime.     RESTASIS 0.05 % ophthalmic emulsion  Generic drug:  cycloSPORINE  Place 1 drop into both eyes 2 (two) times daily.     traMADol-acetaminophen 37.5-325 MG per tablet  Commonly known as:  ULTRACET  Take 1 tablet  by mouth every 6 (six) hours as needed.     traMADol-acetaminophen 37.5-325 MG per tablet  Commonly known as:  ULTRACET  Take 1 tablet by mouth every 6 (six) hours as needed for moderate pain.           Follow-up Information    Follow up with Ailene Rud, MD.   Specialty:  Urology   Why:  per office appointment   Contact information:   [place] [phone]      Gentamycin coverage Monday, and Tuesday.  D/c meds: Ultracet.  Hold antibiotics because of multiple allergies.  Push cathartic/Miralax to avoid constipation.  No driving for 1 week. No pelvic exercise until healed ( 3-6 weeks).  Signed: Jahmarion Popoff I Tyese Finken 02/03/2015, 8:00 AM

## 2015-02-03 NOTE — Progress Notes (Signed)
Patient ambulated with assist outside the hall. No complaints of lightheaded or dizziness, no hypotension episode.

## 2015-02-03 NOTE — Progress Notes (Signed)
Urology Progress Note  1 Day Post-Op  AF, VSS + flatus Foley and packing out.  Walked last night without any dizziness. Green urine has cleared. No pain.  Subjective:     No acute urologic events overnight. Ambulation:   positive Flatus:    positive Bowel movement  negative  Pain: complete resolution  Objective:  Blood pressure 116/51, pulse 73, temperature 97.7 F (36.5 C), temperature source Oral, resp. rate 16, height 5\' 1"  (1.549 m), weight 60.102 kg (132 lb 8 oz), SpO2 100 %.  Physical Exam:  General:  No acute distress, awake Extremities: extremities normal, atraumatic, no cyanosis or edema Genitourinary:  Normal BUS Foley:  out    I/O last 3 completed shifts: In: 6226.3 [P.O.:840; I.V.:4586.3; Other:700; IV Piggyback:100] Out: 2750 [Urine:2650; Blood:100]  Recent Labs     02/02/15  1303  HGB  13.3    Recent Labs     02/02/15  1303  02/03/15  0444  NA  136  134*  K  3.3*  4.5  CL  104  103  CO2  27  27  BUN  6  6  CREATININE  0.53  0.51  CALCIUM  8.2*  8.8  GFRNONAA  88*  89*  GFRAA  >90  >90     No results for input(s): INR, APTT in the last 72 hours.  Invalid input(s): PT   Invalid input(s): ABG  Assessment/Plan: Ok for d/c today.  Catheter removed. Pt is to increase activities as tolerated. Follow up per office appointment.

## 2015-02-03 NOTE — Discharge Instructions (Signed)
Cystocele Repair Cystocele repair is surgery to remove a cystocele, which is a bulging, drooping area (hernia) of the bladder that extends into the vagina. This bulging occurs on the top front wall of the vagina. LET [facility] CARE PROVIDER KNOW ABOUT:   Any allergies you have.  All medicines you are taking, including vitamins, herbs, eye drops, creams, and over-the-counter medicines.  Use of steroids (by mouth or creams).  Previous problems you or members of your family have had with the use of anesthetics.  Any blood disorders you have.  Previous surgeries you have had.  Medical conditions you have.  Possibility of pregnancy, if this applies. RISKS AND COMPLICATIONS  Generally, this is a safe procedure. However, as with any procedure, complications can occur. Possible complications include:  Excessive bleeding.  Infection.  Injury to surrounding structures.  Problems related to anesthetics. The risks will vary depending on the type of anesthetic given.  Problems with the urinary catheter after surgery, such as blockage.  Return of the cystocele. BEFORE THE PROCEDURE   Ask your health care provider about changing or stopping your regular medicines. You may need to stop taking certain medicines 1 week before surgery.  Do not eat or drink anything after midnight the night before surgery.  If you smoke, do not smoke for at least 2 weeks before the surgery.  Do not drink any alcohol for 3 days before the surgery.  Arrange for someone to drive you home after your hospital stay and to help you with activities during recovery. PROCEDURE   You will be given a medicine that makes you sleep through the procedure (general anesthetic) or a medicine injected into your spine that numbs your body below the waist (spinal or epidural anesthetic). You will be asleep or be numbed through the entire procedure.  A thin, flexible tube (Foley catheter) will be placed in your bladder to  drain urine during and after the surgery.  The surgery is performed through the vagina. The front wall of the vagina is opened up, and the muscle between the bladder and vagina is pulled up to its normal position. This is reinforced with stitches or a piece of mesh. This removes the hernia so that the top of the vagina does not fall into the opening of the vagina.  The cut on the front wall of the vagina is then closed with absorbable stitches that do not need to be removed. AFTER THE PROCEDURE   You will be taken to a recovery area where your progress will be closely watched. Your breathing, blood pressure, and pulse (vital signs) will be checked often. When you are stable, you will be taken to a regular hospital room.  You will have a catheter in place to drain your bladder. This will stay in place for 2-7 days or until your bladder is working properly on its own.  You may have gauze packing in the vagina. This will be removed 1-2 days after the surgery.  You will be given pain medicine as needed and may be given a medicine that kills germs (antibiotic).  You will likely need to stay in the hospital for 1-2 days. Document Released: 09/23/2000 Document Revised: 05/29/2013 Document Reviewed: 03/15/2013 [facility] Patient Information 2015 [place], [place]. This information is not intended to replace advice given to you by your health care provider. Make sure you discuss any questions you have with your health care provider.  Anterior and Posterior Colporrhaphy Anterior or posterior colporrhaphy is surgery to fix  a prolapse of organs in the genital tract. Prolapse means the falling down, bulging, dropping, or drooping of an organ. Organs that commonly prolapse include the rectum, bladder, vagina, and uterus. Prolapse can affect a single organ or several organs at the same time. This often worsens when women stop having their monthly periods (menopause) because estrogen loss weakens the muscles and  tissues in the genital tract. In addition, prolapse happens when the organs are damaged or weakened. This commonly happens after childbirth and as a result of aging. Surgery is often done for severe prolapses.  The type of colporrhaphy done depends on the type of genital prolapse. Types of genital prolapse include the following:   Cystocele. This is a prolapse of the upper (anterior) wall of the vagina. The anterior wall bulges into the vagina and brings the bladder with it.   Rectocele. This is a prolapse of the lower (posterior) wall of the vagina. The posterior vaginal wall bulges into the vagina and brings the rectum with it.   Enterocele. This is a prolapse of part of the pelvic organs called the pouch of Douglas. It also involves a portion of the small bowel. It appears as a bulge under the neck of the uterus at the top of the back wall of the vagina.   Procidentia. This is a complete prolapse of the uterus and the cervix. The prolapse can be seen and felt coming out of the vagina. LET [facility] CARE PROVIDER KNOW ABOUT:   Any allergies you have.   All medicines you are taking, including vitamins, herbs, eye drops, creams, and over-the-counter medicines.   Previous problems you or members of your family have had with the use of anesthetics.   Any blood disorders you have.   Previous surgeries you have had.   Medical conditions you have.   Smoking history or history of alcohol use.   Possibility of pregnancy, if this applies.  RISKS AND COMPLICATIONS Generally, anterior or posterior colporrhaphy is a safe procedure. However, as with any procedure, complications can occur. Possible complications include:   Infection.   Damage to other organs during surgery.   Bleeding after surgery.   Problems urinating.   Problems from the anesthetic.  BEFORE THE PROCEDURE  Ask your health care provider about changing or stopping your regular medicines.   Do not eat  or drink anything for at least 8 hours before the surgery.   If you smoke, do not smoke for at least 2 weeks before the surgery.   Make plans to have someone drive you home after your hospital stay. Also, arrange for someone to help you with activities during recovery. PROCEDURE  You may be given medicine to help you relax before the surgery (sedative). During the surgery you will be given medicine to make you sleep through the procedure (general anesthetic) or medicine to numb you from the waist down (spinal anesthetic). This medicine will be given through an intravenous (IV) access tube that is put into one of your veins.  The procedure will vary depending on the type of repair:   Anterior repair. A cut (incision) is made in the midline section of the front part of the vaginal wall. A triangular-shaped piece of vaginal tissue is removed, and the stronger, healthier tissue is sewn together in order to support and suspend the bladder.   Posterior repair. An incision is made midline on the back wall of the vagina. A triangular portion of vaginal skin is removed to expose  the muscle. Excess tissue is removed, and stronger, healthier muscle and ligament tissue is sewn together to support the rectum.   Anterior and posterior repair. Both procedures are done during the same surgery. AFTER THE PROCEDURE You will be taken to a recovery area. Your blood pressure, pulse, breathing, and temperature (vital signs) will be monitored. This is done until you are stable. Then you will be transferred to a hospital room.  After surgery, you will have a small rubber tube in place to drain your bladder (urinary catheter). This will be in place for 2 to 7 days or until your bladder is working properly on its own. The IV access tube will be removed in 1 to 3 days. You may have a gauze packing in your vagina to prevent bleeding. This will be removed 2 or 3 days after the surgery. You will likely need to stay in the  hospital for 3 to 5 days.  Document Released: 12/17/2003 Document Revised: 05/29/2013 Document Reviewed: 02/15/2013 [facility] Patient Information 2015 [place], [place]. This information is not intended to replace advice given to you by your health care provider. Make sure you discuss any questions you have with your health care provider.

## 2015-04-06 ENCOUNTER — Other Ambulatory Visit: Payer: Self-pay

## 2016-03-23 ENCOUNTER — Other Ambulatory Visit (HOSPITAL_COMMUNITY): Payer: Self-pay | Admitting: Urology

## 2016-03-23 DIAGNOSIS — N289 Disorder of kidney and ureter, unspecified: Secondary | ICD-10-CM

## 2016-05-02 ENCOUNTER — Ambulatory Visit (HOSPITAL_COMMUNITY)
Admission: RE | Admit: 2016-05-02 | Discharge: 2016-05-02 | Disposition: A | Discharge status: Home / Self Care | Payer: Medicare Other | Source: Ambulatory Visit | Attending: Urology | Admitting: Urology

## 2016-05-02 DIAGNOSIS — N289 Disorder of kidney and ureter, unspecified: Secondary | ICD-10-CM | POA: Diagnosis present

## 2016-05-02 LAB — POCT I-STAT CREATININE: Creatinine, Ser: 0.6 mg/dL (ref 0.44–1.00)

## 2016-05-02 MED ORDER — GADOBENATE DIMEGLUMINE 529 MG/ML IV SOLN
15.0000 mL | Freq: Once | INTRAVENOUS | Status: AC | PRN
  Administered 2016-05-02: 12 mL via INTRAVENOUS

## 2017-04-14 ENCOUNTER — Other Ambulatory Visit: Payer: Self-pay | Admitting: Urology

## 2017-04-14 DIAGNOSIS — D49519 Neoplasm of unspecified behavior of unspecified kidney: Secondary | ICD-10-CM

## 2017-05-11 ENCOUNTER — Ambulatory Visit (HOSPITAL_COMMUNITY)
Admission: RE | Admit: 2017-05-11 | Discharge: 2017-05-11 | Disposition: A | Discharge status: Home / Self Care | Payer: Medicare Other | Source: Ambulatory Visit | Attending: Urology | Admitting: Urology

## 2017-05-11 DIAGNOSIS — D49519 Neoplasm of unspecified behavior of unspecified kidney: Secondary | ICD-10-CM

## 2017-05-11 DIAGNOSIS — N281 Cyst of kidney, acquired: Secondary | ICD-10-CM | POA: Diagnosis not present

## 2017-05-11 DIAGNOSIS — D3000 Benign neoplasm of unspecified kidney: Secondary | ICD-10-CM | POA: Diagnosis present

## 2017-05-11 LAB — POCT I-STAT CREATININE: Creatinine, Ser: 0.6 mg/dL (ref 0.44–1.00)

## 2017-05-11 MED ORDER — GADOBENATE DIMEGLUMINE 529 MG/ML IV SOLN
10.0000 mL | Freq: Once | INTRAVENOUS | Status: AC | PRN
  Administered 2017-05-11: 9.8 mL via INTRAVENOUS

## 2020-09-20 ENCOUNTER — Other Ambulatory Visit: Payer: Self-pay

## 2020-09-20 ENCOUNTER — Emergency Department (HOSPITAL_BASED_OUTPATIENT_CLINIC_OR_DEPARTMENT_OTHER): Payer: Medicare Other

## 2020-09-20 ENCOUNTER — Encounter (HOSPITAL_BASED_OUTPATIENT_CLINIC_OR_DEPARTMENT_OTHER): Payer: Self-pay | Admitting: *Deleted

## 2020-09-20 ENCOUNTER — Emergency Department (HOSPITAL_BASED_OUTPATIENT_CLINIC_OR_DEPARTMENT_OTHER)
Admission: EM | Admit: 2020-09-20 | Discharge: 2020-09-20 | Disposition: A | Discharge status: Home / Self Care | Payer: Medicare Other | Attending: Emergency Medicine | Admitting: Emergency Medicine

## 2020-09-20 DIAGNOSIS — S0990XA Unspecified injury of head, initial encounter: Secondary | ICD-10-CM | POA: Diagnosis present

## 2020-09-20 DIAGNOSIS — I2510 Atherosclerotic heart disease of native coronary artery without angina pectoris: Secondary | ICD-10-CM | POA: Insufficient documentation

## 2020-09-20 DIAGNOSIS — E039 Hypothyroidism, unspecified: Secondary | ICD-10-CM | POA: Diagnosis not present

## 2020-09-20 DIAGNOSIS — W01198A Fall on same level from slipping, tripping and stumbling with subsequent striking against other object, initial encounter: Secondary | ICD-10-CM | POA: Insufficient documentation

## 2020-09-20 DIAGNOSIS — I10 Essential (primary) hypertension: Secondary | ICD-10-CM | POA: Diagnosis not present

## 2020-09-20 DIAGNOSIS — Z79899 Other long term (current) drug therapy: Secondary | ICD-10-CM | POA: Diagnosis not present

## 2020-09-20 DIAGNOSIS — S06330A Contusion and laceration of cerebrum, unspecified, without loss of consciousness, initial encounter: Secondary | ICD-10-CM | POA: Insufficient documentation

## 2020-09-20 DIAGNOSIS — S20211A Contusion of right front wall of thorax, initial encounter: Secondary | ICD-10-CM | POA: Diagnosis not present

## 2020-09-20 DIAGNOSIS — Z7982 Long term (current) use of aspirin: Secondary | ICD-10-CM | POA: Diagnosis not present

## 2020-09-20 DIAGNOSIS — W19XXXA Unspecified fall, initial encounter: Secondary | ICD-10-CM

## 2020-09-20 DIAGNOSIS — Z955 Presence of coronary angioplasty implant and graft: Secondary | ICD-10-CM | POA: Diagnosis not present

## 2020-09-20 DIAGNOSIS — Z85850 Personal history of malignant neoplasm of thyroid: Secondary | ICD-10-CM | POA: Insufficient documentation

## 2020-09-20 MED ORDER — HYDROCODONE-ACETAMINOPHEN 5-325 MG PO TABS: 1.0000 | ORAL_TABLET | Freq: Four times a day (QID) | ORAL | 0 refills | Status: DC | PRN

## 2020-09-20 NOTE — ED Notes (Signed)
AVS and Rx reviewed with client, discussed safety while taking PO narcotics that has been prescribed, also pt teaching provided re: taking deep breaths, increasing PO fluids, and reviewed signs and symptoms of possible pneumonia, reinforced EDP information on making a follow up appt with the primary care MD. Pt ambulated to POV, escorted pt to exit and assisted into vehicle. Prior to leaving opportunity for questions again provided.

## 2020-09-20 NOTE — ED Notes (Signed)
Pt provided with water and crackers per request

## 2020-09-20 NOTE — ED Notes (Signed)
Pt at imaging.
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
## 2020-09-20 NOTE — Discharge Instructions (Signed)
Work-up following the fall CT head negative for any injury. CT neck for any injury. Chest x-ray with rib series without any obvious broken ribs. But she clearly clinically have bruised ribs. Take the hydrocodone as needed for pain. Stay active. Return for any fevers or pneumonialike symptoms. Make an appointment to follow-up with your primary care doctor over the next few weeks.

## 2020-09-20 NOTE — ED Triage Notes (Signed)
Pt tripped stepping up onto a curb and fell landing on right side of body. C/o pain in her right ribs. She also has bump on right side of forehead. Denies LOC

## 2020-09-20 NOTE — ED Notes (Signed)
Noted to have a very steady gait when ambulating in ED, alert and oriented, follows commands, pt states she feels sore all over. Required minimal assistance standing and ambulating

## 2021-03-31 ENCOUNTER — Emergency Department (HOSPITAL_BASED_OUTPATIENT_CLINIC_OR_DEPARTMENT_OTHER)
Admission: EM | Admit: 2021-03-31 | Discharge: 2021-03-31 | Disposition: A | Discharge status: Home / Self Care | Payer: Medicare Other | Attending: Emergency Medicine | Admitting: Emergency Medicine

## 2021-03-31 ENCOUNTER — Other Ambulatory Visit: Payer: Self-pay

## 2021-03-31 ENCOUNTER — Encounter (HOSPITAL_BASED_OUTPATIENT_CLINIC_OR_DEPARTMENT_OTHER): Payer: Self-pay

## 2021-03-31 DIAGNOSIS — R2232 Localized swelling, mass and lump, left upper limb: Secondary | ICD-10-CM | POA: Diagnosis present

## 2021-03-31 DIAGNOSIS — I2510 Atherosclerotic heart disease of native coronary artery without angina pectoris: Secondary | ICD-10-CM | POA: Diagnosis not present

## 2021-03-31 DIAGNOSIS — L089 Local infection of the skin and subcutaneous tissue, unspecified: Secondary | ICD-10-CM | POA: Diagnosis not present

## 2021-03-31 DIAGNOSIS — I10 Essential (primary) hypertension: Secondary | ICD-10-CM | POA: Diagnosis not present

## 2021-03-31 DIAGNOSIS — E039 Hypothyroidism, unspecified: Secondary | ICD-10-CM | POA: Insufficient documentation

## 2021-03-31 DIAGNOSIS — Z85850 Personal history of malignant neoplasm of thyroid: Secondary | ICD-10-CM | POA: Insufficient documentation

## 2021-03-31 DIAGNOSIS — Z79899 Other long term (current) drug therapy: Secondary | ICD-10-CM | POA: Diagnosis not present

## 2021-03-31 DIAGNOSIS — Z7982 Long term (current) use of aspirin: Secondary | ICD-10-CM | POA: Insufficient documentation

## 2021-03-31 DIAGNOSIS — L03012 Cellulitis of left finger: Secondary | ICD-10-CM | POA: Diagnosis not present

## 2021-03-31 MED ORDER — LIDOCAINE HCL (PF) 1 % IJ SOLN
10.0000 mL | Freq: Once | INTRAMUSCULAR | Status: AC
  Administered 2021-03-31: 10 mL
  Filled 2021-03-31: qty 10

## 2021-03-31 MED ORDER — CLINDAMYCIN HCL 150 MG PO CAPS: 300.0000 mg | ORAL_CAPSULE | Freq: Three times a day (TID) | ORAL | 0 refills | Status: AC

## 2021-03-31 MED ORDER — CLINDAMYCIN HCL 150 MG PO CAPS
300.0000 mg | ORAL_CAPSULE | Freq: Once | ORAL | Status: AC
  Administered 2021-03-31: 300 mg via ORAL
  Filled 2021-03-31: qty 2
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
## 2021-03-31 NOTE — ED Triage Notes (Addendum)
Pt c/o ?insect bite to left ring finger ~2-3 days ago with redness/swelling noted-NAD-steady gait

## 2021-03-31 NOTE — ED Notes (Signed)
Rewrapped coban on finger to slightly loosen bandage.  Pt tolerated well.  Good capillary refill.  Color pink.

## 2021-03-31 NOTE — Discharge Instructions (Addendum)
Take the antibiotics as prescribed.  Take the entire course, even if symptoms improve. If you have diarrhea, take a probiotic while taking the antibiotics.   Keep the dressing on for the next 24 hours.  After this, remove, wash with soap and water, and reapply new dressing as needed.  Continue this daily until you see the hand doctor. Use Tylenol as needed for pain. Use ice to help with pain and swelling. Keep your hand elevated to help with swelling. Call the hand doctor listed below tomorrow to set up a follow-up appointment. Return to the emergency room if you develop fevers, severe worsening pain, redness streaking down your finger into your hand, or any new, worsening, or concerning symptoms.

## 2022-05-25 ENCOUNTER — Emergency Department (HOSPITAL_BASED_OUTPATIENT_CLINIC_OR_DEPARTMENT_OTHER): Payer: Medicare Other

## 2022-05-25 ENCOUNTER — Encounter (HOSPITAL_BASED_OUTPATIENT_CLINIC_OR_DEPARTMENT_OTHER): Payer: Self-pay | Admitting: Emergency Medicine

## 2022-05-25 ENCOUNTER — Emergency Department (HOSPITAL_BASED_OUTPATIENT_CLINIC_OR_DEPARTMENT_OTHER)
Admission: EM | Admit: 2022-05-25 | Discharge: 2022-05-25 | Disposition: A | Discharge status: Home / Self Care | Payer: Medicare Other | Attending: Emergency Medicine | Admitting: Emergency Medicine

## 2022-05-25 ENCOUNTER — Other Ambulatory Visit: Payer: Self-pay

## 2022-05-25 DIAGNOSIS — R110 Nausea: Secondary | ICD-10-CM | POA: Diagnosis not present

## 2022-05-25 DIAGNOSIS — Z79899 Other long term (current) drug therapy: Secondary | ICD-10-CM | POA: Insufficient documentation

## 2022-05-25 DIAGNOSIS — R030 Elevated blood-pressure reading, without diagnosis of hypertension: Secondary | ICD-10-CM | POA: Diagnosis present

## 2022-05-25 DIAGNOSIS — E876 Hypokalemia: Secondary | ICD-10-CM | POA: Insufficient documentation

## 2022-05-25 DIAGNOSIS — R1084 Generalized abdominal pain: Secondary | ICD-10-CM | POA: Insufficient documentation

## 2022-05-25 DIAGNOSIS — E039 Hypothyroidism, unspecified: Secondary | ICD-10-CM | POA: Insufficient documentation

## 2022-05-25 DIAGNOSIS — Z7982 Long term (current) use of aspirin: Secondary | ICD-10-CM | POA: Insufficient documentation

## 2022-05-25 DIAGNOSIS — I10 Essential (primary) hypertension: Secondary | ICD-10-CM | POA: Insufficient documentation

## 2022-05-25 LAB — TROPONIN I (HIGH SENSITIVITY)
Troponin I (High Sensitivity): 6 ng/L (ref <18)
Troponin I (High Sensitivity): 6 ng/L (ref <18)

## 2022-05-25 LAB — COMPREHENSIVE METABOLIC PANEL
ALT: 15 U/L (ref 0–44)
AST: 22 U/L (ref 15–41)
Albumin: 4.5 g/dL (ref 3.5–5.0)
Alkaline Phosphatase: 118 U/L (ref 38–126)
Anion gap: 9 (ref 5–15)
BUN: 9 mg/dL (ref 8–23)
CO2: 32 mmol/L (ref 22–32)
Calcium: 9.7 mg/dL (ref 8.9–10.3)
Chloride: 91 mmol/L — ABNORMAL LOW (ref 98–111)
Creatinine, Ser: 0.61 mg/dL (ref 0.44–1.00)
GFR, Estimated: >60 mL/min (ref >60)
Glucose, Bld: 99 mg/dL (ref 70–99)
Potassium: 3.1 mmol/L — ABNORMAL LOW (ref 3.5–5.1)
Sodium: 132 mmol/L — ABNORMAL LOW (ref 135–145)
Total Bilirubin: 0.9 mg/dL (ref 0.3–1.2)
Total Protein: 8 g/dL (ref 6.5–8.1)

## 2022-05-25 LAB — URINALYSIS, MICROSCOPIC (REFLEX)

## 2022-05-25 LAB — URINALYSIS, ROUTINE W REFLEX MICROSCOPIC
Bilirubin Urine: NEGATIVE
Glucose, UA: NEGATIVE mg/dL
Ketones, ur: NEGATIVE mg/dL
Nitrite: NEGATIVE
Protein, ur: NEGATIVE mg/dL
Specific Gravity, Urine: 1.015 (ref 1.005–1.030)
pH: 7.5 (ref 5.0–8.0)

## 2022-05-25 LAB — CBC WITH DIFFERENTIAL/PLATELET
Abs Immature Granulocytes: 0.01 10*3/uL (ref 0.00–0.07)
Basophils Absolute: 0 10*3/uL (ref 0.0–0.1)
Basophils Relative: 1 %
Eosinophils Absolute: 0.1 10*3/uL (ref 0.0–0.5)
Eosinophils Relative: 2 %
HCT: 44.6 % (ref 36.0–46.0)
Hemoglobin: 15.1 g/dL — ABNORMAL HIGH (ref 12.0–15.0)
Immature Granulocytes: 0 %
Lymphocytes Relative: 28 %
Lymphs Abs: 1.5 10*3/uL (ref 0.7–4.0)
MCH: 29.2 pg (ref 26.0–34.0)
MCHC: 33.9 g/dL (ref 30.0–36.0)
MCV: 86.1 fL (ref 80.0–100.0)
Monocytes Absolute: 0.4 10*3/uL (ref 0.1–1.0)
Monocytes Relative: 7 %
Neutro Abs: 3.4 10*3/uL (ref 1.7–7.7)
Neutrophils Relative %: 62 %
Platelets: 273 10*3/uL (ref 150–400)
RBC: 5.18 MIL/uL — ABNORMAL HIGH (ref 3.87–5.11)
RDW: 13.6 % (ref 11.5–15.5)
WBC: 5.4 10*3/uL (ref 4.0–10.5)
nRBC: 0 % (ref 0.0–0.2)

## 2022-05-25 LAB — LIPASE, BLOOD: Lipase: 44 U/L (ref 11–51)

## 2022-05-25 LAB — LACTIC ACID, PLASMA: Lactic Acid, Venous: 1.4 mmol/L (ref 0.5–1.9)

## 2022-05-25 MED ORDER — POTASSIUM CHLORIDE CRYS ER 20 MEQ PO TBCR
40.0000 meq | EXTENDED_RELEASE_TABLET | Freq: Once | ORAL | Status: AC
  Administered 2022-05-25: 40 meq via ORAL
  Filled 2022-05-25: qty 2

## 2022-05-25 MED ORDER — SODIUM CHLORIDE 0.9 % IV BOLUS
1000.0000 mL | Freq: Once | INTRAVENOUS | Status: AC
  Administered 2022-05-25: 1000 mL via INTRAVENOUS

## 2022-05-25 MED ORDER — ONDANSETRON HCL 4 MG/2ML IJ SOLN: 4.0000 mg | Freq: Once | INTRAMUSCULAR | Status: DC

## 2022-05-25 MED ORDER — HYDROCHLOROTHIAZIDE 25 MG PO TABS
25.0000 mg | ORAL_TABLET | Freq: Once | ORAL | Status: AC
  Administered 2022-05-25: 25 mg via ORAL
  Filled 2022-05-25: qty 1

## 2022-05-25 MED ORDER — FENTANYL CITRATE PF 50 MCG/ML IJ SOSY: 25.0000 ug | PREFILLED_SYRINGE | Freq: Once | INTRAMUSCULAR | Status: DC

## 2022-05-25 MED ORDER — IOHEXOL 350 MG/ML SOLN
100.0000 mL | Freq: Once | INTRAVENOUS | Status: AC | PRN
  Administered 2022-05-25: 100 mL via INTRAVENOUS

## 2022-05-25 NOTE — Discharge Instructions (Addendum)
Your testing is reassuring.  Your CT scan does not show any acute problem in your stomach or chest.  Your blood work shows a mildly low potassium level which was replaced.  Keep a record of your blood pressure and follow-up with your primary doctor for medication adjustments.  Return to the ED with headache, difficulty breathing, chest pain, worsening abdominal pain, fever, vomiting or any other concerns

## 2022-05-25 NOTE — ED Provider Notes (Signed)
[facility] EMERGENCY DEPARTMENT Provider Note   CSN: [id number] Arrival date & time: 05/25/22  5093     History  Chief Complaint  Patient presents with   Hypertension   Abdominal Pain    Sherry Olsen is a 88 y.o. female.  Patient with a history of hypothyroidism, anxiety, hypertension no longer on medications, recurrent abdominal pain presenting with concern for elevated blood pressure.  States that she was having difficulty trying to go to sleep tonight and "felt like something was off with my body".  She cannot elaborate further but states this is what led her to check her blood pressure and found it to be 267-124 systolic at home which concerned her.  States she was taken off blood pressure medication several years ago because her blood pressure improved.  She had taken a hyoscyamine earlier in the evening because of abdominal discomfort and wonders if that could have been a side effect from it.  She takes this several times a week but is never had this reaction from before. After finding her blood pressure was elevated she developed right-sided abdominal pain that has spread diffusely to her entire abdomen associated with nausea.  No vomiting.  No fever.  Urinary frequency but no pain with urination or blood in the urine.  No vaginal bleeding or discharge.  No chest pain or shortness of breath.  No cough or fever. No room spinning dizziness. Abdominal discomfort feels like "trapped gas" which she has had before but in different locations.  Still has appendix.  Previous cholecystectomy and hysterectomy. Denies any headache, visual changes, unilateral weakness, numbness or tingling.,  Visual changes, difficulty speaking or difficulty swallowing.  The history is provided by the patient.  Hypertension Associated symptoms include abdominal pain. Pertinent negatives include no headaches and no shortness of breath.  Abdominal Pain Associated symptoms: nausea   Associated  symptoms: no fever and no shortness of breath        Home Medications Prior to Admission medications   Medication Sig Start Date End Date Taking? Authorizing Provider  acetaminophen (TYLENOL) 500 MG tablet Take 500 mg by mouth every 6 (six) hours as needed for moderate pain. For pain    [provider]  amLODipine (NORVASC) 5 MG tablet Take 5 mg by mouth every evening.    [provider]  aspirin 81 MG chewable tablet Chew 81 mg by mouth every other day. At night.    [provider]  atorvastatin (LIPITOR) 10 MG tablet Take 5 mg by mouth every evening.     [provider]  BENICAR 20 MG tablet Take 20 mg by mouth every evening.  09/21/13   [provider]  calcitonin, salmon, (MIACALCIN/FORTICAL) 200 UNIT/ACT nasal spray Place 1 spray into the nose every morning.     [provider]  calcium citrate-vitamin D (CITRACAL+D) 315-200 MG-UNIT per tablet Take 2 tablets by mouth 2 (two) times daily.     [provider]  Carboxymethylcell-Hypromellose (GENTEAL) 0.25-0.3 % GEL Apply 1 drop to eye at bedtime as needed (dry eyes).     [provider]  cycloSPORINE (RESTASIS) 0.05 % ophthalmic emulsion Place 1 drop into both eyes 2 (two) times daily.    [provider]  diazepam (VALIUM) 5 MG tablet Take 2.5 mg by mouth once as needed for anxiety.     [provider]  dicyclomine (BENTYL) 10 MG capsule Take 10 mg by mouth every morning.    [provider]  fluticasone (FLONASE) 50 MCG/ACT nasal spray Place 2 sprays into both nostrils daily as needed for allergies.  06/17/13   [provider]  HYDROcodone-acetaminophen (NORCO/VICODIN) 5-325 MG tablet Take 1 tablet by mouth every 6 (six) hours as needed for moderate pain. 09/20/20   Fredia Sorrow, MD  lansoprazole (PREVACID) 30 MG capsule Take 30 mg by mouth every morning.     [provider]  levothyroxine (SYNTHROID, LEVOTHROID) 75 MCG  tablet Take 75 mcg by mouth daily before breakfast.    [provider]  nebivolol (BYSTOLIC) 5 MG tablet Take 2.5 mg by mouth every morning.    [provider]  OVER THE COUNTER MEDICATION Take 3 oz by mouth at bedtime. Prune juice.    [provider]  polyethylene glycol (MIRALAX / GLYCOLAX) packet Take 8.5 g by mouth at bedtime.     [provider]  Simethicone (GAS-X EXTRA STRENGTH) 125 MG CAPS Take 1 capsule by mouth 3 (three) times daily as needed (indigestion.).     [provider]  traMADol-acetaminophen (ULTRACET) 37.5-325 MG per tablet Take 1 tablet by mouth every 6 (six) hours as needed. Patient not taking: Reported on 01/23/2015 07/18/14   Carolan Clines, MD  traMADol-acetaminophen (ULTRACET) 37.5-325 MG per tablet Take 1 tablet by mouth every 6 (six) hours as needed for moderate pain. 02/03/15   Carolan Clines, MD      Allergies    Penicillins, Ciprofloxacin, Fosamax [alendronate sodium], Ibuprofen, Keflex [cephalexin], Other, Prilosec [omeprazole], Sulfa antibiotics, and Tetracyclines & related    Review of Systems   Review of Systems  Constitutional:  Negative for fever.  Respiratory:  Negative for chest tightness and shortness of breath.   Gastrointestinal:  Positive for abdominal pain and nausea.  Neurological:  Negative for dizziness, weakness, light-headedness and headaches.    Physical Exam Updated Vital Signs BP (!) 192/80 (BP Location: Left Arm)   Pulse 67   Temp 97.8 F (36.6 C)   Resp 18   Ht 5' 0.75" (1.543 m)   Wt 49 kg   SpO2 98%   BMI 20.57 kg/m  Physical Exam Vitals and nursing note reviewed.  Constitutional:      General: She is not in acute distress.    Appearance: Normal appearance. She is well-developed and normal weight. She is not ill-appearing.  HENT:     Head: Normocephalic and atraumatic.     Mouth/Throat:     Pharynx: No oropharyngeal exudate.  Eyes:     Conjunctiva/sclera:  Conjunctivae normal.     Pupils: Pupils are equal, round, and reactive to light.  Neck:     Comments: No meningismus. Cardiovascular:     Rate and Rhythm: Normal rate and regular rhythm.     Heart sounds: Normal heart sounds. No murmur heard. Pulmonary:     Effort: Pulmonary effort is normal. No respiratory distress.     Breath sounds: Normal breath sounds.  Abdominal:     Palpations: Abdomen is soft.     Tenderness: There is abdominal tenderness. There is guarding. There is no rebound.     Comments: RUQ and RLQ tenderness with guarding  Musculoskeletal:        General: No tenderness. Normal range of motion.     Cervical back: Normal range of motion and neck supple.  Skin:    General: Skin is warm.     Findings: No rash.  Neurological:     General: No focal deficit present.     Mental Status:  She is alert and oriented to person, place, and time. Mental status is at baseline.     Cranial Nerves: No cranial nerve deficit.     Motor: No abnormal muscle tone.     Coordination: Coordination normal.     Comments:  5/5 strength throughout. CN 2-12 intact.Equal grip strength.   Psychiatric:        Behavior: Behavior normal.     ED Results / Procedures / Treatments   Labs (all labs ordered are listed, but only abnormal results are displayed) Labs Reviewed  URINALYSIS, ROUTINE W REFLEX MICROSCOPIC - Abnormal; Notable for the following components:      Result Value   Hgb urine dipstick TRACE (*)    Leukocytes,Ua SMALL (*)    All other components within normal limits  URINALYSIS, MICROSCOPIC (REFLEX) - Abnormal; Notable for the following components:   Bacteria, UA RARE (*)    All other components within normal limits  CBC WITH DIFFERENTIAL/PLATELET - Abnormal; Notable for the following components:   RBC 5.18 (*)    Hemoglobin 15.1 (*)    All other components within normal limits  COMPREHENSIVE METABOLIC PANEL - Abnormal; Notable for the following components:   Sodium 132 (*)     Potassium 3.1 (*)    Chloride 91 (*)    All other components within normal limits  URINE CULTURE  LIPASE, BLOOD  LACTIC ACID, PLASMA  TROPONIN I (HIGH SENSITIVITY)  TROPONIN I (HIGH SENSITIVITY)    EKG EKG Interpretation  Date/Time:  Wednesday May 25 2022 04:24:02 EDT Ventricular Rate:  66 PR Interval:  186 QRS Duration: 89 QT Interval:  418 QTC Calculation: 438 R Axis:   -80 Text Interpretation: Sinus rhythm Left anterior fascicular block Anterior infarct, old No significant change was found Confirmed by Ezequiel Essex [phone]) on 05/25/2022 4:32:21 AM  Radiology CT ABDOMEN PELVIS W CONTRAST  Result Date: 05/25/2022 CLINICAL DATA:  88 year old female with pain. History of complex left renal pole cyst. EXAM: CT ANGIOGRAPHY CHEST CT ABDOMEN AND PELVIS WITH CONTRAST TECHNIQUE: Multidetector CT imaging of the chest was performed using the standard protocol during bolus administration of intravenous contrast. Multiplanar CT image reconstructions and MIPs were obtained to evaluate the vascular anatomy. Multidetector CT imaging of the abdomen and pelvis was performed using the standard protocol during bolus administration of intravenous contrast. RADIATION DOSE REDUCTION: This exam was performed according to the departmental dose-optimization program which includes automated exposure control, adjustment of the mA and/or kV according to patient size and/or use of iterative reconstruction technique. CONTRAST:  153m OMNIPAQUE IOHEXOL 350 MG/ML SOLN COMPARISON:  Prior chest radiographs 01/30/2015 and earlier. CT Abdomen and Pelvis 07/16/2014. Abdomen MRI 05/11/2017. FINDINGS: CTA CHEST FINDINGS Cardiovascular: Aortic timing protocol used. Good intravascular contrast bolus. Negative for thoracic aortic aneurysm or dissection. Mild for age calcified aortic atherosclerosis. Some calcified coronary artery atherosclerosis is evident. Central pulmonary arteries are also patent, enhancing. Mild  cardiomegaly.  No pericardial effusion. Mediastinum/Nodes: Negative. No mediastinal mass or lymphadenopathy. Lungs/Pleura: Mild respiratory motion. Major airways are patent. Somewhat low lung volumes. Mild dependent atelectasis bilaterally. Clustered small peribronchial nodules in the right lung on series 5, image 28 or present in 2015 and are benign (no follow-up imaging recommended). Appearance of some distal airway impaction in the lingula. No consolidation. No pleural effusion. Musculoskeletal: Advanced degenerative changes in the thoracic spine. Osteopenia. No acute or suspicious osseous lesion identified. Review of the MIP images confirms the above findings. CT ABDOMEN and PELVIS FINDINGS Hepatobiliary: Chronically absent  gallbladder. Liver is within normal limits. Pancreas: Negative. Spleen: Negative. Adrenals/Urinary Tract: Adrenal glands are within normal limits. Symmetric renal enhancement and contrast excretion arguing against obstructive uropathy. Bilateral extrarenal pelves appear similar to the 2015 CT. No convincing nephrolithiasis. Small complex lesion of the left renal lower pole appears stable since 2018, and a simple right lower pole cyst is stable (no follow-up imaging recommended). Ureters are decompressed. Chronic pelvic phleboliths. Unremarkable bladder. Stomach/Bowel: No dilated large or small bowel. Diverticulosis of the sigmoid colon. Retained stool in the large bowel. Appendix not delineated. No pericecal inflammation identified. No free air or free fluid identified. Stomach and duodenum are mostly decompressed. Vascular/Lymphatic: Aortoiliac calcified atherosclerosis. Negative for abdominal aortic aneurysm or dissection. Major arterial structures remain patent. Portal venous system is patent. No lymphadenopathy identified. Reproductive: Diminutive or absent. Other: No pelvic free fluid. Musculoskeletal: Stable visualized osseous structures. Mild chronic or congenital L2 vertebral body  wedging. Review of the MIP images confirms the above findings. IMPRESSION: 1. Negative for aortic aneurysm or dissection. Aortic Atherosclerosis (ICD10-I70.0). 2. No acute or inflammatory process identified in the chest, abdomen, or pelvis. Electronically Signed   By: Genevie Ann M.D.   On: 05/25/2022 06:38   CT Angio Chest Aorta W and/or Wo Contrast  Result Date: 05/25/2022 CLINICAL DATA:  88 year old female with pain. History of complex left renal pole cyst. EXAM: CT ANGIOGRAPHY CHEST CT ABDOMEN AND PELVIS WITH CONTRAST TECHNIQUE: Multidetector CT imaging of the chest was performed using the standard protocol during bolus administration of intravenous contrast. Multiplanar CT image reconstructions and MIPs were obtained to evaluate the vascular anatomy. Multidetector CT imaging of the abdomen and pelvis was performed using the standard protocol during bolus administration of intravenous contrast. RADIATION DOSE REDUCTION: This exam was performed according to the departmental dose-optimization program which includes automated exposure control, adjustment of the mA and/or kV according to patient size and/or use of iterative reconstruction technique. CONTRAST:  138m OMNIPAQUE IOHEXOL 350 MG/ML SOLN COMPARISON:  Prior chest radiographs 01/30/2015 and earlier. CT Abdomen and Pelvis 07/16/2014. Abdomen MRI 05/11/2017. FINDINGS: CTA CHEST FINDINGS Cardiovascular: Aortic timing protocol used. Good intravascular contrast bolus. Negative for thoracic aortic aneurysm or dissection. Mild for age calcified aortic atherosclerosis. Some calcified coronary artery atherosclerosis is evident. Central pulmonary arteries are also patent, enhancing. Mild cardiomegaly.  No pericardial effusion. Mediastinum/Nodes: Negative. No mediastinal mass or lymphadenopathy. Lungs/Pleura: Mild respiratory motion. Major airways are patent. Somewhat low lung volumes. Mild dependent atelectasis bilaterally. Clustered small peribronchial nodules in  the right lung on series 5, image 28 or present in 2015 and are benign (no follow-up imaging recommended). Appearance of some distal airway impaction in the lingula. No consolidation. No pleural effusion. Musculoskeletal: Advanced degenerative changes in the thoracic spine. Osteopenia. No acute or suspicious osseous lesion identified. Review of the MIP images confirms the above findings. CT ABDOMEN and PELVIS FINDINGS Hepatobiliary: Chronically absent gallbladder. Liver is within normal limits. Pancreas: Negative. Spleen: Negative. Adrenals/Urinary Tract: Adrenal glands are within normal limits. Symmetric renal enhancement and contrast excretion arguing against obstructive uropathy. Bilateral extrarenal pelves appear similar to the 2015 CT. No convincing nephrolithiasis. Small complex lesion of the left renal lower pole appears stable since 2018, and a simple right lower pole cyst is stable (no follow-up imaging recommended). Ureters are decompressed. Chronic pelvic phleboliths. Unremarkable bladder. Stomach/Bowel: No dilated large or small bowel. Diverticulosis of the sigmoid colon. Retained stool in the large bowel. Appendix not delineated. No pericecal inflammation identified. No free air or free fluid  identified. Stomach and duodenum are mostly decompressed. Vascular/Lymphatic: Aortoiliac calcified atherosclerosis. Negative for abdominal aortic aneurysm or dissection. Major arterial structures remain patent. Portal venous system is patent. No lymphadenopathy identified. Reproductive: Diminutive or absent. Other: No pelvic free fluid. Musculoskeletal: Stable visualized osseous structures. Mild chronic or congenital L2 vertebral body wedging. Review of the MIP images confirms the above findings. IMPRESSION: 1. Negative for aortic aneurysm or dissection. Aortic Atherosclerosis (ICD10-I70.0). 2. No acute or inflammatory process identified in the chest, abdomen, or pelvis. Electronically Signed   By: Genevie Ann M.D.    On: 05/25/2022 06:38    Procedures Procedures    Medications Ordered in ED Medications  sodium chloride 0.9 % bolus 1,000 mL (has no administration in time range)  ondansetron (ZOFRAN) injection 4 mg (has no administration in time range)  fentaNYL (SUBLIMAZE) injection 25 mcg (has no administration in time range)    ED Course/ Medical Decision Making/ A&P                           Medical Decision Making Amount and/or Complexity of Data Reviewed Labs: ordered. Decision-making details documented in ED Course. Radiology: ordered and independent interpretation performed. Decision-making details documented in ED Course. ECG/medicine tests: ordered and independent interpretation performed. Decision-making details documented in ED Course.  Risk Prescription drug management.  Elevated blood pressure this evening after taking hyoscyamine.  Now with right-sided abdominal pain and nausea.  Nonfocal neurological exam.  No chest pain or shortness of breath.  EKG without acute ischemia. No headache or dizziness.  We will hydrate, check labs including urinalysis, CT abdomen pelvis. Denies any headache, chest pain, shortness of breath, visual changes.  Workup reassuring. Potassium low and replaced. UA with WBCs, no symptoms, will send for culture.  CT with no acute findings in abdomen or pelvis. NO evidence of aortic dissection or pulmonary embolism.  PCP note shows that patient is prescribed HCTZ, unclear if she is taking this. Home dose given.   BP improving to 370 systolic.  Abdominal pain has resolved.  No emergent or acute surgical findings seen on imaging.   Low suspicion for ACS, but given vague nature of symptoms, will trend troponin.  Anticipate discharge home if reassuring.  Dr Nechama Guard to assume care.          Final Clinical Impression(s) / ED Diagnoses Final diagnoses:  Generalized abdominal pain  Hypokalemia  Elevated blood pressure reading    Rx / DC  Orders ED Discharge Orders     None         Karston Hyland, Annie Main, MD 05/25/22 [phone]

## 2022-05-25 NOTE — ED Provider Notes (Signed)
  Physical Exam  BP (!) 177/88   Pulse 80   Temp 97.8 F (36.6 C)   Resp 16   Ht 5' 0.75" (1.543 m)   Wt 49 kg   SpO2 96%   BMI 20.57 kg/m   Physical Exam  Procedures  Procedures  ED Course / MDM    Medical Decision Making Amount and/or Complexity of Data Reviewed Labs: ordered. Radiology: ordered. ECG/medicine tests: ordered.  Risk Prescription drug management.   Patient concerned for high BP at home and IBS. Then developed diffuse abdominal pain with nausea.  CTA CAP and CT AP all negative. No acute pathology. Workup all generally unremarkable. Lactate normal, no concern for mesenteric ischemia. UA holding off antibiotics with no symptoms.  Mild hypokalemia 3.1 repleted in the ED.  EKG NSR LAD with no ischemic change or concerning finding.  Initial troponin 6 reassuring.   Pending 2nd troponin and discharge home.  8:22 AM Repeat troponin 6 and flat.  Patient feels much better and blood pressure improved.  Advise close follow-up with PCP and strict return precautions.  Patient and daughter in agreement with this plan.  Safe for discharge.       Elgie Congo, MD 05/25/22 [phone]

## 2022-05-25 NOTE — ED Triage Notes (Signed)
Pt endorsed increased urination.

## 2022-05-25 NOTE — ED Triage Notes (Signed)
Pt to ED due to elevated BP at home. Pt started having abd pain earlier last night and then began checking BP every 30 with reading increasing.

## 2022-05-26 LAB — URINE CULTURE: Culture: NO GROWTH

## 2024-01-15 NOTE — Progress Notes (Unsigned)
   Rubin Payor, PhD, LAT, ATC acting as a scribe for Sherry Graham, MD.  Sherry Olsen is a 89 y.o. female who presents to [facility] at [facility] today for osteoporosis management.  DEXA scan (date, T-score): 01/01/24: Spine= -2.8, L-FN= -2.8, R-FN= -2.5 Prior treatment: walking, nasal spray,  History of Hip, Spine, or Wrist Fx: none Heart disease or stroke: no Cancer: yes- thyroid Oct 2013 Kidney Disease: no Gastric/Peptic Ulcer: no Gastric bypass surgery: no Severe GERD: some, but not severe Hx of seizures: no Age at Menopause: 89y/o Calcium intake: yes- citrical Vitamin D intake: yes Hormone replacement therapy: no Smoking history: never smoked Alcohol: seldom Exercise: walk, working w/ her daughter  Major dental work in past year: no Parents with hip/spine fracture: no Height loss: yes: 62.5" to 60"    Pertinent review of systems: No fevers or chills  Relevant historical information: History of thyroid cancer.  Significant GI upset history.   Exam:  BP (!) 142/80   Pulse 82   Ht 5' 0.75" (1.543 m)   Wt 106 lb (48.1 kg)   SpO2 98%   BMI 20.19 kg/m  General: Well Developed, well nourished, and in no acute distress.   MSK: Gait using a walking stick to ambulate    Lab and Radiology Results Results for orders placed or performed in visit on 01/16/24 (from the past 72 hours)  VITAMIN D 25 Hydroxy (Vit-D Deficiency, Fractures)     Status: None   Collection Time: 01/16/24  3:10 PM  Result Value Ref Range   VITD 63.74 30.00 - 100.00 ng/mL   No results found.     Assessment and Plan: 89 y.o. female with osteoporosis.  Had extensive discussion with the patient and her daughter.  Both are generally not interested in medications for osteoporosis.  We spent time talking about conservative management.  Her daughter is a Systems analyst and has instituted a strengthening and balance training regimen that looks pretty good to me.  She has not  already been doing this exercise regimen so I think it is reasonable to expect that she is getting get some improvement from that.  Will avoid and check vitamin D and continue strength training.  Recheck in about 3 months to see how things are going.   PDMP not reviewed this encounter. Orders Placed This Encounter  Procedures   VITAMIN D 25 Hydroxy (Vit-D Deficiency, Fractures)    Osteoporis    Standing Status:   Future    Number of Occurrences:   1    Expiration Date:   01/15/2025   No orders of the defined types were placed in this encounter.    Discussed warning signs or symptoms. Please see discharge instructions. Patient expresses understanding.   The above documentation has been reviewed and is accurate and complete Sherry Olsen, M.D.

## 2024-01-16 ENCOUNTER — Ambulatory Visit: Admitting: Family Medicine

## 2024-01-16 VITALS — BP 142/80 | HR 82 | Ht 60.75 in | Wt 106.0 lb

## 2024-01-16 DIAGNOSIS — M810 Age-related osteoporosis without current pathological fracture: Secondary | ICD-10-CM | POA: Diagnosis not present

## 2024-01-16 LAB — VITAMIN D 25 HYDROXY (VIT D DEFICIENCY, FRACTURES): VITD: 63.74 ng/mL (ref 30.00–100.00)

## 2024-01-16 NOTE — Patient Instructions (Addendum)
 Thank you for coming in today.   Please get labs today before you leave   Continue strength training  Check back in 3 month

## 2024-01-17 ENCOUNTER — Encounter: Payer: Self-pay | Admitting: Family Medicine

## 2024-01-17 DIAGNOSIS — M810 Age-related osteoporosis without current pathological fracture: Secondary | ICD-10-CM | POA: Insufficient documentation

## 2024-01-17 NOTE — Progress Notes (Signed)
 Vitamin D level is okay at 52.  Continue current dose of vitamin D.

## 2024-08-22 NOTE — Progress Notes (Signed)
 [facility] Female Pelvic Health- Initial Consultation  SUBJECTIVE   Sherry Olsen who is a 89 y.o. female referred by Candi Lonni Mt,* and seen in consultation for a chief complaint of ?  History of Present Illness The patient is an 89 year old female who came in today as a new patient to discuss her urinary urgency and pelvic discomfort. She has been seen by [facility] before and has been taking mirabegron for her urinary urgency. She has also used anticholinergic medications in the past and had two prolapse repairs, the last one in 2016 with Dr. Chales. She is here with her daughter.  She mentioned that [place] Candi referred her here. It is difficult today to discern exactly what her complaints are. She reports abdomina/ pelvic bloating and discomfort. She also has nocturia 3-4x/ night, though admits this is improved from the 5-6 it used to be.   She has not gotten any answers about her bloating/ discomfort. She has a limited diet and found the most relief from hycosamine but worries about it being addicting. She is very careful with medications and can only take Tylenol  for pain.  She hasn't tried pelvic floor physical therapy. She knows she has prolapse but doesn't think it bothers her, not as bad as it used to be at least.    The following sections were reviewed with the patient and confirmed: Past Medical History:   has a past medical history of Anxiety, Benign essential hypertension, Benign paroxysmal positional vertigo, CAD (coronary artery disease), Cataract, Cervical vertebral fusion, Cystocele with rectocele, Cystocele with uterine descensus, Diverticulosis, Diverticulosis of colon, ETD (eustachian tube dysfunction), Family history of colon cancer in father, GERD (gastroesophageal reflux disease), Hemorrhoids, Hypercholesterolemia, Hypokalemia, Hypomagnesemia, Hypothyroidism, Irritable bowel syndrome with both constipation and diarrhea,  Macular degeneration, Multinodular goiter, Osteoarthritis, Primary thyroid  cancer    (CMD), Sensorineural hearing loss, and Tinnitus.   Past Surgical History: She  has a past surgical history that includes Appendectomy; Cholecystectomy; Colonoscopy; Tonsillectomy; Thyroid  surgery; Hemorrhoid surgery; Hysterectomy; Small intestine surgery; Bartholin gland cyst excision; and Spine surgery.   Medications: She has a current medication list which includes the following prescription(s): ocular vitamins, acetaminophen , atorvastatin , calcium  carbonate, calcium  citrate-vitamin d3, cyanocobalamin, diazepam , hyoscyamine , levothyroxine , losartan , myrbetriq, omega 3-dha-epa-fish oil, vista tears, polyethylene glycol, simethicone , clindamycin , estradiol , and lansoprazole.   Allergies: Patient is allergic to alendronate, ibuprofen, penicillin, raloxifene, omeprazole, celecoxib, citalopram analogues, linzess [linaclotide], cephalexin, ciprofloxacin, sulfamethoxazole, and tetracyclines.   Social History: Patient  reports that she has never smoked. She has never used smokeless tobacco. She reports current alcohol  use. She reports that she does not use drugs.    Family History: family history includes Colon cancer in her father; Heart attack in her mother.   Review of Systems: See HPI and scanned records for relevant ROS GU / GYN: As per HPI.  OBJECTIVE    Physical Exam: BP (!) 163/72   Pulse 101   Temp 97.6 F (36.4 C) (Temporal)   Resp 15   Ht 1.524 m (5')   SpO2 97%   BMI 20.98 kg/m   Lab Results  Component Value Date   BLOODU Negative 02/14/2023   LEUKOUA 75 (A) 02/14/2023   NITRITE Negative 02/14/2023     PVR: 4 ml by bladder scan  Constitutional:  Well nourished, well developed in no acute distress.  Psych:  Normal mood, appropriate affect, alert and oriented x 3 Neck:  Supple, normal appearance with normal range of motion Respiratory:  Normal respiratory  effort.  Cardiovascular:   No lower extremity edema.  Skin:  Warm and dry, no lesions.  Abdomen:  No masses.  No abdominal tenderness.    Urogynecologic Evaluation:  Pelvic Exam:  External genitalia normal in appearance and negative for erythema, lesions or discharge. Bartholin's and Skene's glands normal in appearance.  Urethra and urethral meatus midline, Positive caruncle, negative for tenderness, discharge, masses. Negative CST, Negative hypermobile  Bladder negative for tenderness or distention. Vagina negative for discharge, irritation or lesions. severe atrophy Uterus surgically absent   Pelvic floor strength: 1 strength/ 1 displacement / 1 duration  Pelvic floor is not hypertonic. Pelvic floor is not painful to palpation.  POP-Q  Aa: -2  Ba: -2  C: 0   Gh 3  Pb: 3  TVL: 7   Ap: +1  Bp: +1  D: x    Pelvic exam chaperone: LPN   ASSESSMENT AND PLAN   Kennedy Brines is referred today for pelvic bloating/ discomfort  Vaginal vault prolapse Cystocele, midline Stage 2/3. While not overtly uncomfortable, may be contributing to some of her pelvic discomfort. Very poor muscle tone/ engagement as well.  Has never tried a pessary, I think this is a reasonable option. She will consider  Vaginal atrophy Severe and likely adding to some of her discomfort.  We discussed vulvovaginal atrophy/ vaginal dryness Non-hormonal options: coconut oil, vitamin E and hyaluronic acid based moisturizers (Gynetrof, Via, Revaree) Hormonal options: vaginal estrogen, vaginal prasterone (Intrarosa), Osphenaa   Vaginal estrogen has very low systemic absorption, meaning that it does not have the same risks as oral estrogen formulations.  A 2025 meta-analysis of over 24,000 breast cancer survivors found that about 12% of women who used vaginal estrogen had a cancer recurrence, compared with 16% who did not. This study reinforces that vaginal estrogen does not increase the risk of breast cancer recurrence.  Overactive  bladder Nocturia Currently on mirabegron. She's seen improvements, though still with some issues. Will continue for now  Pelvic pain in female Bloating Unclear why she continues to have bloating and pain; difficult to fully understand her symptoms. She has followed with GI and finds the most relief from hycosamine. Discussed that this may worsen constipation but she should use if helpful   Plan:  -start vaginal estrogen -consider pessary, pelvic floor physical therapy   Comer LITTIE Drone, MD   Attending Physician 10:02 PM Thu 08/22/2024

## 2024-10-02 NOTE — Telephone Encounter (Signed)
 I called and relayed the msg. Pt's daughter voiced understanding and thanked me. Updated the number to be hers, per her request. Referral placed. End of conversation.

## 2024-10-02 NOTE — Telephone Encounter (Signed)
 We had referred her to Dr. Leopoldo in August but it looks like they called several times and were unable to reach her. Ok for referral to hand ortho but need to make sure number being called is answered to facilitate scheduling

## 2024-10-02 NOTE — Telephone Encounter (Signed)
 Copied from CRM #[id number]. Topic: Referral - New Referral/Order Request >> Oct 02, 2024 10:09 AM Amy B wrote: Lajean Mar is calling other request   Include all details related to the request(s) below:  Mar, daughter, calls to report that patient's pain in her right hand is worsening and is requesting referral to a hand specialist within the [facility] system. Mar is available by phone.    Confirm and type the Best Contact Number below:  Patient/caller contact number:    [phone]         [] Home  [x] Mobile  [] Work [] Other   [x] Okay to leave a voicemail   Medication List:  Current Outpatient Medications:    A-C-E-zinc ox-cupric ox-lutein (Ocular Vitamins) 7,160 unit- 113 mg-0.5 mg tab, Take 4 tablets by mouth Once Daily., Disp: , Rfl:    acetaminophen  (TYLENOL ) 325 mg tablet, Take 650 mg by mouth as needed (pain)., Disp: , Rfl:    atorvastatin  (LIPITOR) 10 mg tablet, Take 0.5 tablets (5 mg total) by mouth at bedtime., Disp: 45 tablet, Rfl: 2   calcium  carbonate (TUMS) 500 mg (200 mg calcium ) chewable tablet, Take 1 tablet by mouth daily as needed for heartburn., Disp: , Rfl:    calcium  citrate-vitamin D3 250 mg-5 mcg (200 unit) tab, Take 1 tablet by mouth Once Daily., Disp: , Rfl:    clindamycin  (CLINDAGEL) 1 % gel, APPLY TO AFFECTED AREA TWICE A DAY, Disp: 30 g, Rfl: 0   cyanocobalamin (VITAMIN B12) 1,000 mcg subl SL tablet, Place 1 tablet under the tongue Once Daily., Disp: 1 tablet, Rfl: 0   diazePAM  (VALIUM ) 5 mg tablet, Take 5 mg by mouth daily as needed for anxiety., Disp: , Rfl:    estradioL  (ESTRACE ) 0.01 % (0.1 mg/gram) vaginal cream, Insert 0.5 g into the vagina See Admin Instructions. Insert blueberry size amount of cream on finger (or 0.5 grams with applicator) in vagina every other night, Disp: 42.5 g, Rfl: 3   hyoscyamine  (LEVSIN ) 0.125 mg tablet, TAKE 1 TABLET BY MOUTH EVERY 6 HOURS AS NEEDED FOR CRAMPING., Disp: 30 tablet, Rfl: 3   lansoprazole (PREVACID)  15 mg delayed-release capsule, TAKE 2 CAPSULES (30 MG TOTAL) BY MOUTH DAILY., Disp: 180 capsule, Rfl: 1   levothyroxine  (SYNTHROID ) 75 mcg tablet, Take 1.5 tablets by mouth on Sundays only. Take 1 tablet (75mcg) all other days., Disp: , Rfl:    losartan  (COZAAR ) 25 mg tablet, Take 0.5 tablets (12.5 mg total) by mouth daily., Disp: 45 tablet, Rfl: 0   Myrbetriq 50 mg Tb24, TAKE 1 TABLET BY MOUTH EVERY DAY, Disp: , Rfl: 3   omega 3-dha-epa-fish oil (OMEGA 3) 1,000 mg capsule, Take 1 g by mouth Once Daily., Disp: , Rfl:    peg 400-propylene glycol (Vista Tears) 0.4-0.3 % drop ophthalmic solution, Administer 1 drop into each eyes 2 (two) times a day., Disp: , Rfl:    polyethylene glycol (GLYCOLAX ) 17 gram packet, Take 8.5 g by mouth Once Daily., Disp: , Rfl:    simethicone  (MYLICON) 80 mg chewable tablet, Take 80 mg by mouth every 6 (six) hours as needed for flatulence., Disp: , Rfl:      Medication Request/Refills: Pharmacy Information (if applicable)   [x] Not Applicable       []  Pharmacy listed  Send Medication Request to:                                                 []   Pharmacy not listed (added to pharmacy list in Epic) Send Medication Request to:      Listed Pharmacies: [id number] - [place], [place] - [address] - PHONE: [phone] - FAX: [phone]

## 2024-10-17 NOTE — Telephone Encounter (Signed)
 Given her weakness she needs to go to ER to be seen. May be dehydrated from the nausea and vomiting.

## 2024-10-17 NOTE — Telephone Encounter (Signed)
 Copied from CRM #[id number]. Topic: Medication/Rx - Medication Request - Symptoms >> Oct 17, 2024  8:42 AM Jadine C wrote: Hesper, [place] is calling for medication request (Obtain: Medication Name, Dosage, Quantity, Frequency, Quantity Requested (example: 30-day supply.)   Include all details related to the request(s) below:   Patient was seen at urgent care, she was being treated for flu but she's been getting worse . Tamaflu helped a little. Ems was called but today she started throwing up . Patient may have a UTI? Nothing hurts or burns when she goes to the bathroom.   141/80 blood  pressure  Pulse 78   with blood sugar of 112.  Patient is still very weak   Confirm and type the Best Contact Number below:  Patient/caller contact number:          [phone] /Rahi   [] Home  [x] Mobile  [] Work [] Other   [] Okay to leave a voicemail   Medication List:  Current Outpatient Medications:    A-C-E-zinc ox-cupric ox-lutein (Ocular Vitamins) 7,160 unit- 113 mg-0.5 mg tab, Take 4 tablets by mouth Once Daily., Disp: , Rfl:    acetaminophen  (TYLENOL ) 325 mg tablet, Take 650 mg by mouth as needed (pain)., Disp: , Rfl:    atorvastatin  (LIPITOR) 10 mg tablet, Take 0.5 tablets (5 mg total) by mouth at bedtime., Disp: 45 tablet, Rfl: 2   calcium  carbonate (TUMS) 500 mg (200 mg calcium ) chewable tablet, Take 1 tablet by mouth daily as needed for heartburn., Disp: , Rfl:    calcium  citrate-vitamin D3 250 mg-5 mcg (200 unit) tab, Take 1 tablet by mouth Once Daily., Disp: , Rfl:    clindamycin  (CLINDAGEL) 1 % gel, APPLY TO AFFECTED AREA TWICE A DAY, Disp: 30 g, Rfl: 0   cyanocobalamin (VITAMIN B12) 1,000 mcg subl SL tablet, Place 1 tablet under the tongue Once Daily., Disp: 1 tablet, Rfl: 0   diazePAM  (VALIUM ) 5 mg tablet, Take 5 mg by mouth daily as needed for anxiety., Disp: , Rfl:    estradioL  (ESTRACE ) 0.01 % (0.1 mg/gram) vaginal cream, Insert 0.5 g into the vagina See Admin  Instructions. Insert blueberry size amount of cream on finger (or 0.5 grams with applicator) in vagina every other night, Disp: 42.5 g, Rfl: 3   hyoscyamine  (LEVSIN ) 0.125 mg tablet, TAKE 1 TABLET BY MOUTH EVERY 6 HOURS AS NEEDED FOR CRAMPING., Disp: 30 tablet, Rfl: 3   lansoprazole (PREVACID) 15 mg delayed-release capsule, TAKE 2 CAPSULES (30 MG TOTAL) BY MOUTH DAILY., Disp: 180 capsule, Rfl: 1   levothyroxine  (SYNTHROID ) 75 mcg tablet, Take 1.5 tablets by mouth on Sundays only. Take 1 tablet (75mcg) all other days., Disp: , Rfl:    losartan  (COZAAR ) 25 mg tablet, Take 0.5 tablets (12.5 mg total) by mouth daily., Disp: 45 tablet, Rfl: 0   Myrbetriq 50 mg Tb24, TAKE 1 TABLET BY MOUTH EVERY DAY, Disp: , Rfl: 3   omega 3-dha-epa-fish oil (OMEGA 3) 1,000 mg capsule, Take 1 g by mouth Once Daily., Disp: , Rfl:    peg 400-propylene glycol (Vista Tears) 0.4-0.3 % drop ophthalmic solution, Administer 1 drop into each eyes 2 (two) times a day., Disp: , Rfl:    polyethylene glycol (GLYCOLAX ) 17 gram packet, Take 8.5 g by mouth Once Daily., Disp: , Rfl:    simethicone  (MYLICON) 80 mg chewable tablet, Take 80 mg by mouth every 6 (six) hours as needed for flatulence., Disp: , Rfl:      Medication Request/Refills: Pharmacy Information (if  applicable)   [] Not Applicable       []  Pharmacy listed  Send Medication Request to:                                                 [] Pharmacy not listed (added to pharmacy list in Epic) Send Medication Request to:      Listed Pharmacies: [id number] - [place], [place] - [address] - PHONE: [phone] - FAX: [phone]

## 2024-10-17 NOTE — Telephone Encounter (Signed)
 Tamiflu can cause nausea and vomiting, want to send something to help or ?

## 2024-10-17 NOTE — Telephone Encounter (Signed)
 Can we see if she will come in to be seen today? Both Dr. LULLA and kevin have availability today.

## 2024-10-17 NOTE — Telephone Encounter (Signed)
 I called, spoke w/her daughter Katheryn and stated she is not taking her mom to the ED. She said she was told that it is a 17h wait time for someone to be brought back. She went on and said, does Lauren want to take her and sit for 17h? I think not. I asked her what is it that she wants us  to do to help? She said, I don't know. She continued w/her mom isn't dehydrated cause her urine is really light and that's one of the main signs of dehydration. She declined going to an urgent care. She stated, what can they do? They can't give her an IV. I stated and we can't either. She said she will continue to check her vitals like EMS did, which have been good. I stated I would forward the msg to [place]. She said ok and thanked me. End of conversation.

## 2024-10-18 ENCOUNTER — Encounter (HOSPITAL_BASED_OUTPATIENT_CLINIC_OR_DEPARTMENT_OTHER): Payer: Self-pay

## 2024-10-18 ENCOUNTER — Emergency Department (HOSPITAL_BASED_OUTPATIENT_CLINIC_OR_DEPARTMENT_OTHER)

## 2024-10-18 ENCOUNTER — Other Ambulatory Visit: Payer: Self-pay

## 2024-10-18 ENCOUNTER — Emergency Department (HOSPITAL_BASED_OUTPATIENT_CLINIC_OR_DEPARTMENT_OTHER): Admission: EM | Admit: 2024-10-18 | Discharge: 2024-10-18 | Disposition: A | Discharge status: Home / Self Care

## 2024-10-18 DIAGNOSIS — I10 Essential (primary) hypertension: Secondary | ICD-10-CM | POA: Diagnosis not present

## 2024-10-18 DIAGNOSIS — E876 Hypokalemia: Secondary | ICD-10-CM | POA: Insufficient documentation

## 2024-10-18 DIAGNOSIS — E871 Hypo-osmolality and hyponatremia: Secondary | ICD-10-CM | POA: Insufficient documentation

## 2024-10-18 DIAGNOSIS — I2510 Atherosclerotic heart disease of native coronary artery without angina pectoris: Secondary | ICD-10-CM | POA: Insufficient documentation

## 2024-10-18 DIAGNOSIS — R1084 Generalized abdominal pain: Secondary | ICD-10-CM | POA: Insufficient documentation

## 2024-10-18 DIAGNOSIS — R109 Unspecified abdominal pain: Secondary | ICD-10-CM

## 2024-10-18 LAB — URINALYSIS, ROUTINE W REFLEX MICROSCOPIC
Bilirubin Urine: NEGATIVE
Glucose, UA: NEGATIVE mg/dL
Hgb urine dipstick: NEGATIVE
Ketones, ur: NEGATIVE mg/dL
Nitrite: NEGATIVE
Protein, ur: NEGATIVE mg/dL
Specific Gravity, Urine: 1.01 (ref 1.005–1.030)
pH: 7 (ref 5.0–8.0)

## 2024-10-18 LAB — COMPREHENSIVE METABOLIC PANEL WITH GFR
ALT: 19 U/L (ref 0–44)
AST: 29 U/L (ref 15–41)
Albumin: 4.5 g/dL (ref 3.5–5.0)
Alkaline Phosphatase: 112 U/L (ref 38–126)
Anion gap: 10 (ref 5–15)
BUN: 9 mg/dL (ref 8–23)
CO2: 29 mmol/L (ref 22–32)
Calcium: 8.9 mg/dL (ref 8.9–10.3)
Chloride: 89 mmol/L — ABNORMAL LOW (ref 98–111)
Creatinine, Ser: 0.61 mg/dL (ref 0.44–1.00)
GFR, Estimated: >60 mL/min
Glucose, Bld: 117 mg/dL — ABNORMAL HIGH (ref 70–99)
Potassium: 3.3 mmol/L — ABNORMAL LOW (ref 3.5–5.1)
Sodium: 128 mmol/L — ABNORMAL LOW (ref 135–145)
Total Bilirubin: 0.8 mg/dL (ref 0.0–1.2)
Total Protein: 7.2 g/dL (ref 6.5–8.1)

## 2024-10-18 LAB — LACTIC ACID, PLASMA: Lactic Acid, Venous: 1.9 mmol/L (ref 0.5–1.9)

## 2024-10-18 LAB — CBC
HCT: 43.6 % (ref 36.0–46.0)
Hemoglobin: 14.2 g/dL (ref 12.0–15.0)
MCH: 28.1 pg (ref 26.0–34.0)
MCHC: 32.6 g/dL (ref 30.0–36.0)
MCV: 86.2 fL (ref 80.0–100.0)
Platelets: 246 K/uL (ref 150–400)
RBC: 5.06 MIL/uL (ref 3.87–5.11)
RDW: 13.3 % (ref 11.5–15.5)
WBC: 5.8 K/uL (ref 4.0–10.5)
nRBC: 0 % (ref 0.0–0.2)

## 2024-10-18 LAB — URINALYSIS, MICROSCOPIC (REFLEX)

## 2024-10-18 LAB — LIPASE, BLOOD: Lipase: 35 U/L (ref 11–51)

## 2024-10-18 MED ORDER — POTASSIUM CHLORIDE CRYS ER 20 MEQ PO TBCR
40.0000 meq | EXTENDED_RELEASE_TABLET | Freq: Once | ORAL | Status: AC
  Administered 2024-10-18: 40 meq via ORAL
  Filled 2024-10-18: qty 2

## 2024-10-18 MED ORDER — SODIUM CHLORIDE 0.9 % IV BOLUS
1000.0000 mL | Freq: Once | INTRAVENOUS | Status: AC
  Administered 2024-10-18: 1000 mL via INTRAVENOUS

## 2024-10-18 MED ORDER — ONDANSETRON HCL 4 MG/2ML IJ SOLN
4.0000 mg | Freq: Once | INTRAMUSCULAR | Status: AC
  Administered 2024-10-18: 4 mg via INTRAVENOUS
  Filled 2024-10-18: qty 2

## 2024-10-18 MED ORDER — MORPHINE SULFATE (PF) 2 MG/ML IV SOLN
1.0000 mg | Freq: Once | INTRAVENOUS | Status: AC
  Administered 2024-10-18: 1 mg via INTRAVENOUS
  Filled 2024-10-18: qty 1

## 2024-10-18 MED ORDER — IOHEXOL 300 MG/ML  SOLN
80.0000 mL | Freq: Once | INTRAMUSCULAR | Status: AC | PRN
  Administered 2024-10-18: 80 mL via INTRAVENOUS

## 2024-10-18 NOTE — ED Triage Notes (Signed)
 Flu last week. Treated with Tamiflu Abdominal pain began on the 31st. 8 Bms on Wednesday and vomited x one yesterday morning. Seen by PCP yesterday. Described as uncomfortable and bloating. Difficult having a BM this morning. Hx of bowel obstruction and perforation

## 2024-10-18 NOTE — Telephone Encounter (Signed)
 Patient's daughter calling stating that she thinks she has a major bowel blockage, she is in major pain, vitals off the chart.  She wants a scan done today ASAP.  Patient's daughter Katheryn can be reached at [phone]

## 2024-10-18 NOTE — Discharge Instructions (Signed)
 Please follow-up with your primary doctor.  You may take the Zofran  that was previously prescribed to you for nausea.  As discussed your sodium level is low, please try to increase your fluid intake.  Return for fevers, chills, lightheadedness, passout, chest pain, shortness of breath, uncontrolled nausea or vomiting, or any new or worsening symptoms that are concerning to you.

## 2024-10-18 NOTE — ED Provider Notes (Signed)
 " [place] [facility] Provider Note   CSN: [id number] Arrival date & time: 10/18/24  9185     Patient presents with: Abdominal Pain   Sherry Olsen is a 89 y.o. female.   This is an 89 year old female presenting emergency department with abdominal pain.  Had flulike symptoms a week and a half ago.  Daughter notes went to urgent care tested negative for flu, but was still prescribed Tamiflu empirically.  She was improving until yesterday when developed generalized abdominal pain with nausea vomiting.  Her last bowel movement was Wednesday.  Does not think she has passed flatus.  Daughter Notes prior abdominal obstruction/perforation,   Abdominal Pain      Prior to Admission medications  Medication Sig Start Date End Date Taking? Authorizing Provider  atorvastatin  (LIPITOR) 10 MG tablet Take 5 mg by mouth every evening.     [provider]  calcium  citrate-vitamin D  (CITRACAL+D) 315-200 MG-UNIT per tablet Take 2 tablets by mouth 2 (two) times daily.     [provider]  diazepam  (VALIUM ) 5 MG tablet Take 2.5 mg by mouth once as needed for anxiety.     [provider]  hyoscyamine  (LEVSIN ) 0.125 MG tablet TAKE 1 TABLET BY MOUTH EVERY 6 HOURS AS NEEDED FOR CRAMPING. 08/16/22   [provider]  lansoprazole (PREVACID) 30 MG capsule Take 30 mg by mouth every morning.     [provider]  levothyroxine  (SYNTHROID ) 75 MCG tablet Take by mouth.    [provider]  losartan  (COZAAR ) 25 MG tablet Take 25 mg by mouth daily.    [provider]  mirabegron ER (MYRBETRIQ) 50 MG TB24 tablet Take 1 tablet by mouth daily. 04/06/15   [provider]  Omega-3 1000 MG CAPS Take by mouth.    [provider]  polyethylene glycol (MIRALAX  / GLYCOLAX ) packet Take 8.5 g by mouth at bedtime.     [provider]    Allergies: Penicillins, Ciprofloxacin, Fosamax [alendronate sodium],  Ibuprofen, Keflex [cephalexin], Other, Prilosec [omeprazole], Sulfa antibiotics, Tetracyclines & related, and Zinc    Review of Systems  Gastrointestinal:  Positive for abdominal pain.    Updated Vital Signs BP (!) 173/83   Pulse 64   Temp 98 F (36.7 C)   Resp 14   SpO2 98%   Physical Exam Vitals and nursing note reviewed.  Constitutional:      General: She is not in acute distress.    Appearance: She is not toxic-appearing.  HENT:     Head: Normocephalic.  Cardiovascular:     Rate and Rhythm: Normal rate and regular rhythm.  Pulmonary:     Effort: Pulmonary effort is normal.     Breath sounds: Normal breath sounds.  Abdominal:     Palpations: Abdomen is soft.     Tenderness: There is generalized abdominal tenderness. There is no guarding or rebound.  Skin:    Capillary Refill: Capillary refill takes less than 2 seconds.  Neurological:     General: No focal deficit present.     Mental Status: She is alert.  Psychiatric:        Mood and Affect: Mood normal.        Behavior: Behavior normal.     (all labs ordered are listed, but only abnormal results are displayed) Labs Reviewed  COMPREHENSIVE METABOLIC PANEL WITH GFR - Abnormal; Notable for the following components:      Result Value   Sodium  128 (*)    Potassium 3.3 (*)    Chloride 89 (*)    Glucose, Bld 117 (*)    All other components within normal limits  URINALYSIS, ROUTINE W REFLEX MICROSCOPIC - Abnormal; Notable for the following components:   Leukocytes,Ua TRACE (*)    All other components within normal limits  URINALYSIS, MICROSCOPIC (REFLEX) - Abnormal; Notable for the following components:   Bacteria, UA RARE (*)    All other components within normal limits  LIPASE, BLOOD  CBC  LACTIC ACID, PLASMA    EKG: EKG Interpretation Date/Time:  Friday October 18 2024 08:35:03 EST Ventricular Rate:  76 PR Interval:  196 QRS Duration:  81 QT Interval:  374 QTC Calculation: 421 R Axis:   -88  Text  Interpretation: Sinus rhythm Left anterior fascicular block Anteroseptal infarct, old Confirmed by Neysa Clap [phone]) on 10/18/2024 8:49:50 AM  Radiology: CT ABDOMEN PELVIS W CONTRAST Result Date: 10/18/2024 EXAM: CT ABDOMEN AND PELVIS WITH CONTRAST 10/18/2024 10:11:27 AM TECHNIQUE: CT of the abdomen and pelvis was performed with the administration of 80 mL of iohexol  (OMNIPAQUE ) 300 MG/ML solution. Multiplanar reformatted images are provided for review. Automated exposure control, iterative reconstruction, and/or weight-based adjustment of the mA/kV was utilized to reduce the radiation dose to as low as reasonably achievable. COMPARISON: 05/25/2022. 05/11/2017 CLINICAL HISTORY: Bowel obstruction suspected. FINDINGS: LOWER CHEST: Mild cardiomegaly with a right atrial predominance. Fibrolinear scarring and subsegmental atelectasis in the lung bases. LIVER: Focal fatty infiltration adjacent to the falciform ligament. GALLBLADDER AND BILE DUCTS: Cholecystectomy. Mild to moderate intrahepatic and extrahepatic biliary ductal dilation, is likely related to the prior cholecystectomy. SPLEEN: No acute abnormality. PANCREAS: No acute abnormality. ADRENAL GLANDS: No acute abnormality. KIDNEYS, URETERS AND BLADDER: Similar appearance of multiple bilateral renal cysts. Unchanged appearance of the 1.1 cm left lower pole cyst containing a region of nodular enhancement. Fluid filled extrarenal pelves bilaterally, without hydronephrosis. No nephrolithiasis. No perinephric or periureteral stranding. Urinary bladder is distended without focal abnormality. GI AND BOWEL: Stomach demonstrates no acute abnormality. Diffuse descending and sigmoid colonic diverticulosis. Minimal fecal loading in the right colon. There is no bowel obstruction. PERITONEUM AND RETROPERITONEUM: No ascites. No free air. VASCULATURE: Aorta is normal in caliber. Diffuse aortoiliac atherosclerosis. LYMPH NODES: No lymphadenopathy. REPRODUCTIVE ORGANS:  Hysterectomy. BONES AND SOFT TISSUES: Diffuse osteopenia. Multilevel degenerative disc disease of the spine. No acute osseous abnormality. No focal soft tissue abnormality. IMPRESSION: 1. No acute intraabdominal or pelvic abnormality. 2. Diffuse descending and sigmoid colonic diverticulosis. Minimal fecal loading in the right colon. No changes of acute diverticulitis. Electronically signed by: Rogelia Myers MD MD 10/18/2024 11:11 AM EST RP Workstation: [id number]     Procedures   Medications Ordered in the ED  sodium chloride  0.9 % bolus 1,000 mL (0 mLs Intravenous Stopped 10/18/24 1106)  ondansetron  (ZOFRAN ) injection 4 mg (4 mg Intravenous Given 10/18/24 0854)  morphine  (PF) 2 MG/ML injection 1 mg (1 mg Intravenous Given 10/18/24 0854)  iohexol  (OMNIPAQUE ) 300 MG/ML solution 80 mL (80 mLs Intravenous Contrast Given 10/18/24 0951)  potassium chloride  SA (KLOR-CON  M) CR tablet 40 mEq (40 mEq Oral Given 10/18/24 1145)    Clinical Course as of 10/18/24 1150  Fri Oct 18, 2024  0902 CBC No leukocytosis.  No anemia [TY]  0923 Comprehensive metabolic panel(!) Low-sodium; stable compared to outpatient labs drawn recently.  Hypokalemia noted.  Will replete. [TY]  0949 Lipase: 35 Pancreatitis unlikely [TY]  0949 Lactic Acid, Venous: 1.9 Not indicative of bowel ischemia [  TY]  1027 CT ABDOMEN PELVIS W CONTRAST On my independent review, do not appreciate free air [TY]  1129 CT ABDOMEN PELVIS W CONTRAST IMPRESSION: 1. No acute intraabdominal or pelvic abnormality. 2. Diffuse descending and sigmoid colonic diverticulosis. Minimal fecal loading in the right colon. No changes of acute diverticulitis.  Electronically signed by: Rogelia Myers MD MD 10/18/2024 11:11 AM EST RP Workstation: [id number]   [TY]  1148 Patient feeling much improved after medications and fluids.  Smiling, tolerating p.o.  Discussed findings with likely dehydration given her low sodium potassium chloride .  Encouraged her to increase  her fluid intake and follow-up with her primary doctor as soon as possible for recheck.  Given negative workup and imaging otherwise, we will discharge patient in stable condition is no acute emergent condition identified. [TY]    Clinical Course User Index [TY] Neysa Caron PARAS, DO                                 Medical Decision Making This is an 89 year old female complex past medical history to include obesity, hyperlipidemia, hypertension, CAD, prior cholecystectomy appendectomy hysterectomy.  She is afebrile nontachycardic, hypertensive.  Has soft abdomen, but diffuse tenderness.  Per chart review had labs done yesterday with PCP with a sodium of 127.  Normal liver enzymes/bilirubin.  Normal CBC.  Daughter at bedside notes that they were prescribed Zofran , but only took a half a dose yesterday.  Concern for possible SBO/obstructive process.  Will get CT scan to evaluate.  Will get screening labs as well.  Patient does not appear to be in discomfort.  IV morphine , Zofran  and IV fluids ordered to treat patient's symptoms while awaiting workup.  Amount and/or Complexity of Data Reviewed Independent Historian:     Details: Daughter noted history of prior obstruction with perforation External Data Reviewed:     Details: See above Labs: ordered. Decision-making details documented in ED Course. Radiology: ordered and independent interpretation performed. Decision-making details documented in ED Course. ECG/medicine tests: independent interpretation performed.  Risk Prescription drug management.       Final diagnoses:  None    ED Discharge Orders     None          Neysa Caron PARAS, DO 10/18/24 1150  "

## 2024-10-18 NOTE — ED Notes (Signed)
Patient to bedside commode with assist.

## 2024-10-20 ENCOUNTER — Observation Stay (HOSPITAL_BASED_OUTPATIENT_CLINIC_OR_DEPARTMENT_OTHER)
Admission: EM | Admit: 2024-10-20 | Discharge: 2024-10-23 | Disposition: A | Attending: Internal Medicine | Admitting: Internal Medicine

## 2024-10-20 ENCOUNTER — Emergency Department (HOSPITAL_BASED_OUTPATIENT_CLINIC_OR_DEPARTMENT_OTHER)

## 2024-10-20 ENCOUNTER — Encounter (HOSPITAL_BASED_OUTPATIENT_CLINIC_OR_DEPARTMENT_OTHER): Payer: Self-pay | Admitting: Emergency Medicine

## 2024-10-20 ENCOUNTER — Other Ambulatory Visit: Payer: Self-pay

## 2024-10-20 DIAGNOSIS — E039 Hypothyroidism, unspecified: Secondary | ICD-10-CM | POA: Diagnosis present

## 2024-10-20 DIAGNOSIS — N816 Rectocele: Secondary | ICD-10-CM | POA: Diagnosis not present

## 2024-10-20 DIAGNOSIS — Z743 Need for continuous supervision: Secondary | ICD-10-CM | POA: Diagnosis not present

## 2024-10-20 DIAGNOSIS — R1084 Generalized abdominal pain: Secondary | ICD-10-CM

## 2024-10-20 DIAGNOSIS — I2510 Atherosclerotic heart disease of native coronary artery without angina pectoris: Secondary | ICD-10-CM | POA: Diagnosis not present

## 2024-10-20 DIAGNOSIS — I10 Essential (primary) hypertension: Secondary | ICD-10-CM | POA: Diagnosis not present

## 2024-10-20 DIAGNOSIS — R109 Unspecified abdominal pain: Secondary | ICD-10-CM | POA: Diagnosis present

## 2024-10-20 DIAGNOSIS — Z79899 Other long term (current) drug therapy: Secondary | ICD-10-CM | POA: Insufficient documentation

## 2024-10-20 DIAGNOSIS — E876 Hypokalemia: Secondary | ICD-10-CM | POA: Diagnosis not present

## 2024-10-20 DIAGNOSIS — K5900 Constipation, unspecified: Principal | ICD-10-CM

## 2024-10-20 DIAGNOSIS — E785 Hyperlipidemia, unspecified: Secondary | ICD-10-CM | POA: Diagnosis not present

## 2024-10-20 DIAGNOSIS — N8110 Cystocele, unspecified: Secondary | ICD-10-CM | POA: Diagnosis present

## 2024-10-20 DIAGNOSIS — R262 Difficulty in walking, not elsewhere classified: Secondary | ICD-10-CM | POA: Insufficient documentation

## 2024-10-20 DIAGNOSIS — Z85850 Personal history of malignant neoplasm of thyroid: Secondary | ICD-10-CM | POA: Diagnosis not present

## 2024-10-20 DIAGNOSIS — E871 Hypo-osmolality and hyponatremia: Secondary | ICD-10-CM | POA: Diagnosis not present

## 2024-10-20 LAB — CBC
HCT: 42.8 % (ref 36.0–46.0)
Hemoglobin: 14.2 g/dL (ref 12.0–15.0)
MCH: 28.6 pg (ref 26.0–34.0)
MCHC: 33.2 g/dL (ref 30.0–36.0)
MCV: 86.3 fL (ref 80.0–100.0)
Platelets: 261 K/uL (ref 150–400)
RBC: 4.96 MIL/uL (ref 3.87–5.11)
RDW: 13.3 % (ref 11.5–15.5)
WBC: 5.2 K/uL (ref 4.0–10.5)
nRBC: 0 % (ref 0.0–0.2)

## 2024-10-20 LAB — URINALYSIS, ROUTINE W REFLEX MICROSCOPIC
Bilirubin Urine: NEGATIVE
Glucose, UA: NEGATIVE mg/dL
Ketones, ur: NEGATIVE mg/dL
Leukocytes,Ua: NEGATIVE
Nitrite: NEGATIVE
Protein, ur: >=300 mg/dL — AB
Specific Gravity, Urine: >=1.03 (ref 1.005–1.030)
pH: 5.5 (ref 5.0–8.0)

## 2024-10-20 LAB — COMPREHENSIVE METABOLIC PANEL WITH GFR
ALT: 25 U/L (ref 0–44)
AST: 34 U/L (ref 15–41)
Albumin: 4.7 g/dL (ref 3.5–5.0)
Alkaline Phosphatase: 114 U/L (ref 38–126)
Anion gap: 9 (ref 5–15)
BUN: 6 mg/dL — ABNORMAL LOW (ref 8–23)
CO2: 29 mmol/L (ref 22–32)
Calcium: 9.4 mg/dL (ref 8.9–10.3)
Chloride: 91 mmol/L — ABNORMAL LOW (ref 98–111)
Creatinine, Ser: 0.54 mg/dL (ref 0.44–1.00)
GFR, Estimated: >60 mL/min
Glucose, Bld: 114 mg/dL — ABNORMAL HIGH (ref 70–99)
Potassium: 4.3 mmol/L (ref 3.5–5.1)
Sodium: 129 mmol/L — ABNORMAL LOW (ref 135–145)
Total Bilirubin: 0.8 mg/dL (ref 0.0–1.2)
Total Protein: 7.4 g/dL (ref 6.5–8.1)

## 2024-10-20 LAB — URINALYSIS, MICROSCOPIC (REFLEX)

## 2024-10-20 LAB — LACTIC ACID, PLASMA
Lactic Acid, Venous: 1.4 mmol/L (ref 0.5–1.9)
Lactic Acid, Venous: 1.2 mmol/L (ref 0.5–1.9)

## 2024-10-20 LAB — LIPASE, BLOOD: Lipase: 36 U/L (ref 11–51)

## 2024-10-20 MED ORDER — SENNOSIDES-DOCUSATE SODIUM 8.6-50 MG PO TABS: 1.0000 | ORAL_TABLET | Freq: Two times a day (BID) | ORAL | Status: DC

## 2024-10-20 MED ORDER — SENNOSIDES-DOCUSATE SODIUM 8.6-50 MG PO TABS
1.0000 | ORAL_TABLET | Freq: Two times a day (BID) | ORAL | Status: DC
  Administered 2024-10-21 – 2024-10-23 (×5): 1 via ORAL
  Filled 2024-10-20 (×5): qty 1

## 2024-10-20 MED ORDER — SODIUM CHLORIDE 0.9 % IV SOLN: INTRAVENOUS | Status: AC

## 2024-10-20 MED ORDER — SIMETHICONE 40 MG/0.6ML PO SUSP
40.0000 mg | Freq: Four times a day (QID) | ORAL | Status: DC | PRN
  Filled 2024-10-20: qty 0.6

## 2024-10-20 MED ORDER — SORBITOL 70 % SOLN
30.0000 mL | Freq: Every day | Status: DC | PRN
  Administered 2024-10-21 – 2024-10-22 (×2): 30 mL via ORAL
  Filled 2024-10-20 (×3): qty 30

## 2024-10-20 MED ORDER — ONDANSETRON HCL 4 MG/2ML IJ SOLN
4.0000 mg | Freq: Once | INTRAMUSCULAR | Status: DC
  Filled 2024-10-20: qty 2

## 2024-10-20 MED ORDER — ACETAMINOPHEN 325 MG PO TABS: 650.0000 mg | ORAL_TABLET | Freq: Four times a day (QID) | ORAL | Status: DC | PRN

## 2024-10-20 MED ORDER — POLYETHYLENE GLYCOL 3350 17 G PO PACK: 17.0000 g | PACK | Freq: Two times a day (BID) | ORAL | Status: DC

## 2024-10-20 MED ORDER — FLEET ENEMA RE ENEM: 1.0000 | ENEMA | Freq: Every day | RECTAL | Status: DC | PRN

## 2024-10-20 MED ORDER — METOCLOPRAMIDE HCL 5 MG/ML IJ SOLN: 5.0000 mg | Freq: Once | INTRAMUSCULAR | Status: DC

## 2024-10-20 MED ORDER — LOSARTAN POTASSIUM 25 MG PO TABS
25.0000 mg | ORAL_TABLET | Freq: Every day | ORAL | Status: DC
  Administered 2024-10-21 – 2024-10-22 (×2): 6.25 mg via ORAL
  Filled 2024-10-20 (×2): qty 1

## 2024-10-20 MED ORDER — FLEET ENEMA RE ENEM
1.0000 | ENEMA | Freq: Once | RECTAL | Status: AC
  Administered 2024-10-20: 1 via RECTAL
  Filled 2024-10-20: qty 1

## 2024-10-20 MED ORDER — SODIUM CHLORIDE 0.9 % IV SOLN: INTRAVENOUS | Status: DC

## 2024-10-20 MED ORDER — HYOSCYAMINE SULFATE 0.125 MG PO TABS: 0.1250 mg | ORAL_TABLET | Freq: Four times a day (QID) | ORAL | Status: DC | PRN

## 2024-10-20 MED ORDER — IOHEXOL 300 MG/ML  SOLN
100.0000 mL | Freq: Once | INTRAMUSCULAR | Status: AC | PRN
  Administered 2024-10-20: 70 mL via INTRAVENOUS

## 2024-10-20 MED ORDER — ONDANSETRON HCL 4 MG/2ML IJ SOLN
4.0000 mg | Freq: Four times a day (QID) | INTRAMUSCULAR | Status: DC | PRN
  Administered 2024-10-21: 4 mg via INTRAVENOUS
  Filled 2024-10-20 (×2): qty 2

## 2024-10-20 MED ORDER — ATORVASTATIN CALCIUM 10 MG PO TABS
5.0000 mg | ORAL_TABLET | Freq: Every evening | ORAL | Status: DC
  Administered 2024-10-21 – 2024-10-22 (×2): 5 mg via ORAL
  Filled 2024-10-20 (×2): qty 1

## 2024-10-20 MED ORDER — POLYETHYLENE GLYCOL 3350 17 G PO PACK
17.0000 g | PACK | Freq: Two times a day (BID) | ORAL | Status: DC
  Administered 2024-10-21 – 2024-10-23 (×5): 17 g via ORAL
  Filled 2024-10-20 (×5): qty 1

## 2024-10-20 MED ORDER — ONDANSETRON HCL 4 MG PO TABS: 4.0000 mg | ORAL_TABLET | Freq: Four times a day (QID) | ORAL | Status: DC | PRN

## 2024-10-20 MED ORDER — LACTATED RINGERS IV BOLUS
1000.0000 mL | Freq: Once | INTRAVENOUS | Status: AC
  Administered 2024-10-20: 1000 mL via INTRAVENOUS

## 2024-10-20 MED ORDER — ACETAMINOPHEN 650 MG RE SUPP: 650.0000 mg | Freq: Four times a day (QID) | RECTAL | Status: DC | PRN

## 2024-10-20 MED ORDER — ENOXAPARIN SODIUM 30 MG/0.3ML IJ SOSY
30.0000 mg | PREFILLED_SYRINGE | INTRAMUSCULAR | Status: DC
  Administered 2024-10-20 – 2024-10-22 (×3): 30 mg via SUBCUTANEOUS
  Filled 2024-10-20 (×3): qty 0.3

## 2024-10-20 MED ORDER — ONDANSETRON HCL 4 MG/2ML IJ SOLN: 4.0000 mg | Freq: Once | INTRAMUSCULAR | Status: DC

## 2024-10-20 MED ORDER — LEVOTHYROXINE SODIUM 75 MCG PO TABS
75.0000 ug | ORAL_TABLET | Freq: Every day | ORAL | Status: DC
  Administered 2024-10-21 – 2024-10-23 (×3): 75 ug via ORAL
  Filled 2024-10-20 (×3): qty 1

## 2024-10-20 NOTE — Assessment & Plan Note (Signed)
 Resume home losartan  (confirm pending med hx)

## 2024-10-20 NOTE — ED Notes (Signed)
 Pt is High Fall Risk. Yellow armband placed on patient. Nonskid socks/patient shoes on. Fall Risk magnet placed on pt room door. Pt given call light and instructed to call for assistance before getting up. Pt/visitor verbalized understanding. Pt is High Fall Risk. Yellow armband placed on patient. Nonskid socks/patient shoes on. Fall Risk magnet placed on pt room door. Pt given call light and instructed to call for assistance before getting up. Pt/visitor verbalized understanding.

## 2024-10-20 NOTE — ED Triage Notes (Signed)
 Pt presents with continued and worsening abd pain that she was seen for 1/9. No significant BM since being seen, very little PO intake, and pt states she feels like gagging. Afebrile.

## 2024-10-20 NOTE — Assessment & Plan Note (Signed)
 Sodium 129 on admission, in setting of poor PO intake, likely hypovolemic. Given 1 L bolus LR in the ED. --Continue NS @ 75 cc/hr --Monitor BMP

## 2024-10-20 NOTE — Assessment & Plan Note (Signed)
 S/p resection of primary thyroid  cancer. --Continue Synthroid 

## 2024-10-20 NOTE — Progress Notes (Signed)
" °   10/20/24 1800  Vitals  Temp 98.3 F (36.8 C)  Temp Source Oral  BP (!) 156/67  MAP (mmHg) 95  BP Location Left Arm  BP Method Automatic  Patient Position (if appropriate) Lying  Pulse Rate Source Dinamap  Level of Consciousness  Level of Consciousness Alert  MEWS COLOR  MEWS Score Color Green  Oxygen Therapy  SpO2 97 %  O2 Device Room Air  MEWS Score  MEWS Temp 0  MEWS Systolic 0  MEWS Pulse 0  MEWS RR 0  MEWS LOC 0  MEWS Score 0   New admit from [facility], Dr Fausto made aware. Call bell within reach. "

## 2024-10-20 NOTE — ED Notes (Signed)
 Took pt to the [facility] and she pooped a little bit. Pt complained of it being painful when pooping. Doctor notified

## 2024-10-20 NOTE — Progress Notes (Signed)
 Daughter Katheryn will take patient cell phone and hearing aids overnight for charging.
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
## 2024-10-20 NOTE — ED Notes (Signed)
 ED Provider at bedside, accompanied by this RN for rectal exam.

## 2024-10-20 NOTE — Progress Notes (Signed)
 The patient was seen and examined at bedside.  Endorses 2 watery stools after her arrival here at [facility].  On exam, the patient is alert and oriented x 3.  Her abdomen is soft nontender with bowel sounds present.  No peripheral edema noted.  She is on IV fluid hydration and tolerating well.  She currently has no new complaints.  We will resume bowel regimen at 6 AM.   Time: 15 minutes.

## 2024-10-20 NOTE — Assessment & Plan Note (Addendum)
 Fecal impaction / Constipation Enteritis and possible Ileus Hx of IBS C&D - follows with GI at [facility], Dr. Candi. CT abdomen/pelvis showing diffuse enteritis, possible ileus, fecal impaction. S/p enema x 1 in the ED. --Continue bowel reigmen - Miralax  BID, Senna-S BID, PRN sorbitol , Fleet enema daily PRN --Monitor abdominal exam --Maintenance IV fluids --Resume home Levsin  PRN, Simethicone  PRN --Full liquids as tolerated for now -- make NPO in having increased distention or nausea/vomiting develop.   --Repeat abdominal x-ray in AM --Consider GI vs surgery consult pending clinical course, if not improving

## 2024-10-20 NOTE — Assessment & Plan Note (Addendum)
 Chronic.  Complicates current acute issue and fecal impaction.  Most recent Urology note reviewed.   CT did show fecal impaction with stool within the prolapsed rectum.    Mgmt as above.

## 2024-10-20 NOTE — ED Notes (Signed)
[facility] at bedside to assume care of patient. 

## 2024-10-20 NOTE — ED Provider Notes (Signed)
 " [place] [facility] AT [facility] Provider Note   CSN: [id number] Arrival date & time: 10/20/24  9295     Patient presents with: Abdominal Pain   Sherry Olsen is a 89 y.o. female.   This is a 89 year old female presenting emergency department for abdominal pain nausea.  I evaluated patient on 1/9 with similar symptoms here in the emergency department.  She has had continuation of symptoms since that time.  Has had a few small bowel movements.  Has been taking MiraLAX .  Symptoms again worsened this morning.  Generalized pain.  No fevers no chills.   Abdominal Pain      Prior to Admission medications  Medication Sig Start Date End Date Taking? Authorizing Provider  atorvastatin  (LIPITOR) 10 MG tablet Take 5 mg by mouth every evening.     [provider]  calcium  citrate-vitamin D  (CITRACAL+D) 315-200 MG-UNIT per tablet Take 2 tablets by mouth 2 (two) times daily.     [provider]  diazepam  (VALIUM ) 5 MG tablet Take 2.5 mg by mouth once as needed for anxiety.     [provider]  hyoscyamine  (LEVSIN ) 0.125 MG tablet TAKE 1 TABLET BY MOUTH EVERY 6 HOURS AS NEEDED FOR CRAMPING. 08/16/22   [provider]  lansoprazole (PREVACID) 30 MG capsule Take 30 mg by mouth every morning.     [provider]  levothyroxine  (SYNTHROID ) 75 MCG tablet Take by mouth.    [provider]  losartan  (COZAAR ) 25 MG tablet Take 25 mg by mouth daily.    [provider]  mirabegron ER (MYRBETRIQ) 50 MG TB24 tablet Take 1 tablet by mouth daily. 04/06/15   [provider]  Omega-3 1000 MG CAPS Take by mouth.    [provider]  polyethylene glycol (MIRALAX  / GLYCOLAX ) packet Take 8.5 g by mouth at bedtime.     [provider]    Allergies: Penicillins, Ciprofloxacin, Fosamax [alendronate sodium], Ibuprofen, Keflex [cephalexin], Other, Prilosec [omeprazole], Sulfa antibiotics, Tetracyclines &  related, and Zinc    Review of Systems  Gastrointestinal:  Positive for abdominal pain.    Updated Vital Signs BP (!) 151/73   Pulse 63   Temp 98 F (36.7 C) (Oral)   Resp 18   Ht 5' 0.07 (1.526 m)   Wt 47.6 kg   SpO2 96%   BMI 20.46 kg/m   Physical Exam Vitals and nursing note reviewed.  Constitutional:      General: She is not in acute distress.    Appearance: She is not toxic-appearing.  HENT:     Head: Normocephalic.  Cardiovascular:     Rate and Rhythm: Normal rate and regular rhythm.  Pulmonary:     Effort: Pulmonary effort is normal.     Breath sounds: Normal breath sounds.  Abdominal:     General: Abdomen is flat. Bowel sounds are normal.     Palpations: Abdomen is soft.     Tenderness: There is generalized abdominal tenderness (mild). There is no guarding or rebound.  Musculoskeletal:        General: Normal range of motion.  Skin:    General: Skin is warm and dry.     Capillary Refill: Capillary refill takes less than 2 seconds.  Neurological:     Mental Status: She is alert and oriented to person, place, and time.  Psychiatric:        Mood and Affect: Mood normal.  Behavior: Behavior normal.     (all labs ordered are listed, but only abnormal results are displayed) Labs Reviewed  COMPREHENSIVE METABOLIC PANEL WITH GFR - Abnormal; Notable for the following components:      Result Value   Sodium 129 (*)    Chloride 91 (*)    Glucose, Bld 114 (*)    BUN 6 (*)    All other components within normal limits  URINALYSIS, ROUTINE W REFLEX MICROSCOPIC - Abnormal; Notable for the following components:   APPearance TURBID (*)    Hgb urine dipstick SMALL (*)    Protein, ur >=300 (*)    All other components within normal limits  URINALYSIS, MICROSCOPIC (REFLEX) - Abnormal; Notable for the following components:   Bacteria, UA MANY (*)    All other components within normal limits  CBC  LIPASE, BLOOD  LACTIC ACID, PLASMA  LACTIC ACID, PLASMA     EKG: None  Radiology: CT ABDOMEN PELVIS W CONTRAST Result Date: 10/20/2024 EXAM: CT ABDOMEN AND PELVIS WITH CONTRAST 10/20/2024 09:39:29 AM TECHNIQUE: CT of the abdomen and pelvis was performed with the administration of 70 mL of iohexol  (OMNIPAQUE ) 300 MG/ML solution. Multiplanar reformatted images are provided for review. Automated exposure control, iterative reconstruction, and/or weight-based adjustment of the mA/kV was utilized to reduce the radiation dose to as low as reasonably achievable. COMPARISON: CT abdomen and pelvis 10/18/2024 and earlier. CLINICAL HISTORY: 89 year old female with progressive abdominal pain. Suspected bowel obstruction. FINDINGS: LOWER CHEST: Stable mild cardiomegaly. Stable mild lung base atelectasis and scarring. Stable mild elevation of the right hemidiaphragm, probable normal variant. LIVER: The liver is unremarkable. GALLBLADDER AND BILE DUCTS: Chronic cholecystectomy. Stable postoperative bile duct appearance. SPLEEN: No acute abnormality. PANCREAS: No acute abnormality. ADRENAL GLANDS: No acute abnormality. KIDNEYS, URETERS AND BLADDER: Symmetric renal contrast and excretion. No stones in the kidneys or ureters. No hydronephrosis. No perinephric or periureteral stranding. Mildly distended but otherwise unremarkable urinary bladder. GI AND BOWEL: Stomach and duodenum are decompressed. Pelvic floor laxity and progressive rectal stool ball (series 2 image 73). Upstream proximal rectum and distal sigmoid colon in the pelvis are mostly decompressed. Questionable generalized hyperenhancement of large and small bowel mucosa, including in the decompressed rectum on series 2 image 65. Descending colon is decompressed. Transverse colon is redundant. Similar redundant right colon with retained stool. Cecum mostly located in the pelvis. Decompressed terminal ileum. Appendix not identified. Widespread gas and fluid containing small bowel loops which are generally 2 cm diameter  (approaching the upper limits of normal). No evidence of mechanical bowel obstruction. PERITONEUM AND RETROPERITONEUM: No discrete mesenteric inflammation. No pneumoperitoneum or free fluid. VASCULATURE: Aorta is normal in caliber. Advanced calcified atherosclerosis in the abdomen and pelvis. Major arterial structures and portal venous system are patent. LYMPH NODES: No lymphadenopathy. REPRODUCTIVE ORGANS: Chronically absent uterus, diminutive or absent ovaries. BONES AND SOFT TISSUES: Chronic spine degeneration and levoconvex scoliosis. No acute osseous abnormality. No focal soft tissue abnormality. IMPRESSION: 1. Generalized enteritis and ileus are difficult to exclude, but no evidence of mechanical bowel obstruction. But highly redundant large bowel with retained stool, including in prolapsed rectum; consider developing fecal impaction. 2. No other acute or inflammatory process identified. Advanced atherosclerosis. Electronically signed by: Helayne Hurst MD MD 10/20/2024 09:50 AM EST RP Workstation: [id number]   DG Abd Portable 1 View Result Date: 10/20/2024 EXAM: 1 VIEW XRAY OF THE ABDOMEN 10/20/2024 08:33:52 AM COMPARISON: CT abdomen and pelvis 10/18/2024. CLINICAL HISTORY: 89 year old female. Constipation. FINDINGS: BOWEL: Mildly prominent small bowel  loops throughout the abdomen is increased. Moderate colonic stool burden, including conspicuous stool ball in the rectum . SOFT TISSUES: Right upper quadrant surgical clips noted. Atherosclerotic plaque noted. BONES: Levoscoliosis and degenerative changes of the spine. No acute fracture. No acute osseous abnormality. IMPRESSION: 1. From recent CT, increased mild small-bowel prominence and moderate stool burden. Consider milder developing ileus in addition to rectal fecal impaction. Electronically signed by: Helayne Hurst MD MD 10/20/2024 08:48 AM EST RP Workstation: [id number]     Procedures   Medications Ordered in the ED  0.9 %  sodium chloride  infusion  (has no administration in time range)  lactated ringers  bolus 1,000 mL (0 mLs Intravenous Stopped 10/20/24 1218)  iohexol  (OMNIPAQUE ) 300 MG/ML solution 100 mL (70 mLs Intravenous Contrast Given 10/20/24 0926)  sodium phosphate  (FLEET) enema 1 enema (1 enema Rectal Given 10/20/24 1225)    Clinical Course as of 10/20/24 1544  Sun Oct 20, 2024  0846 Comprehensive metabolic panel(!) Stable hyponatremia, slightly improved from 2 days ago.  No hypokalemia.  Normal kidney function.  No transaminitis [TY]  0847 CBC No leukocytosis.  No anemia [TY]  0847 Lipase: 36 [TY]  1138 CT ABDOMEN PELVIS W CONTRAST IMPRESSION: 1. Generalized enteritis and ileus are difficult to exclude, but no evidence of mechanical bowel obstruction. But highly redundant large bowel with retained stool, including in prolapsed rectum; consider developing fecal impaction. 2. No other acute or inflammatory process identified. Advanced atherosclerosis.  Electronically signed by: Helayne Hurst MD MD 10/20/2024 09:50 AM EST RP Workstation: [id number]   [TY]  8594 Spoke with hospitalist who agrees to admit patient. [TY]    Clinical Course User Index [TY] Neysa Caron PARAS, DO                                 Medical Decision Making This is a 89 year old female presenting emergency department with abdominal pain.  Vital signs reassuring.  Physical exam with mild abdominal tenderness.  Does have a complex past medical history to include hemorrhoids, GI bleeding, anxiety, hypertension, CAD does have cholecystectomy appendectomy.  Daughter at bedside noted she has increased her water intake been taking MiraLAX , but still not having significant bowel movements.  I did do a CT scan on 1/1 which did show some constipation.  X-ray today with worsening burden possible ileus.  Therefore given patient's continued symptoms repeat CT scan showed which again with constipation equivocal enteritis versus ileus.  Her labs today with improved sodium,  no leukocytosis to suggest infectious process.  Lactate not elevated.  Attempted enema as well as digital disimpaction with no bowel movements or improvement in symptoms.  Given patient's age, comorbidities with possible ileus on CT scan, will admit for observation and bowel regimen.  Patient and daughter updated on plan and they are agreeable for admission at this time.  Case discussed with hospitalist who agrees to admit patient  Amount and/or Complexity of Data Reviewed Labs: ordered. Decision-making details documented in ED Course. Radiology: ordered. Decision-making details documented in ED Course.  Risk OTC drugs. Prescription drug management. Decision regarding hospitalization.      Final diagnoses:  Constipation, unspecified constipation type    ED Discharge Orders     None          Neysa Caron PARAS, DO 10/20/24 1544  "
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
## 2024-10-20 NOTE — ED Notes (Signed)
 ED Provider at bedside.

## 2024-10-21 ENCOUNTER — Observation Stay (HOSPITAL_COMMUNITY)

## 2024-10-21 DIAGNOSIS — R1084 Generalized abdominal pain: Secondary | ICD-10-CM | POA: Diagnosis not present

## 2024-10-21 DIAGNOSIS — E039 Hypothyroidism, unspecified: Secondary | ICD-10-CM

## 2024-10-21 DIAGNOSIS — E871 Hypo-osmolality and hyponatremia: Secondary | ICD-10-CM | POA: Diagnosis not present

## 2024-10-21 DIAGNOSIS — R109 Unspecified abdominal pain: Secondary | ICD-10-CM | POA: Diagnosis not present

## 2024-10-21 DIAGNOSIS — N8110 Cystocele, unspecified: Secondary | ICD-10-CM

## 2024-10-21 DIAGNOSIS — E876 Hypokalemia: Secondary | ICD-10-CM

## 2024-10-21 DIAGNOSIS — N816 Rectocele: Secondary | ICD-10-CM

## 2024-10-21 DIAGNOSIS — I10 Essential (primary) hypertension: Secondary | ICD-10-CM | POA: Diagnosis not present

## 2024-10-21 LAB — GLUCOSE, CAPILLARY: Glucose-Capillary: 99 mg/dL (ref 70–99)

## 2024-10-21 LAB — BASIC METABOLIC PANEL WITH GFR
Anion gap: 11 (ref 5–15)
BUN: 8 mg/dL (ref 8–23)
CO2: 25 mmol/L (ref 22–32)
Calcium: 8.7 mg/dL — ABNORMAL LOW (ref 8.9–10.3)
Chloride: 96 mmol/L — ABNORMAL LOW (ref 98–111)
Creatinine, Ser: 0.58 mg/dL (ref 0.44–1.00)
GFR, Estimated: >60 mL/min
Glucose, Bld: 86 mg/dL (ref 70–99)
Potassium: 3 mmol/L — ABNORMAL LOW (ref 3.5–5.1)
Sodium: 133 mmol/L — ABNORMAL LOW (ref 135–145)

## 2024-10-21 LAB — MAGNESIUM: Magnesium: 2.2 mg/dL (ref 1.7–2.4)

## 2024-10-21 MED ORDER — POTASSIUM CHLORIDE CRYS ER 20 MEQ PO TBCR
40.0000 meq | EXTENDED_RELEASE_TABLET | Freq: Two times a day (BID) | ORAL | Status: AC
  Administered 2024-10-21 (×2): 40 meq via ORAL
  Filled 2024-10-21 (×2): qty 2

## 2024-10-21 NOTE — Care Management Obs Status (Signed)
 MEDICARE OBSERVATION STATUS NOTIFICATION   Patient Details  Name: Sherry Olsen MRN: [id number] Date of Birth: 16-Jun-1933   Medicare Observation Status Notification Given:  Yes    Nena LITTIE Coffee, RN 10/21/2024, 11:59 AM

## 2024-10-21 NOTE — Plan of Care (Signed)
   Problem: Clinical Measurements: Goal: Ability to maintain clinical measurements within normal limits will improve Outcome: Progressing Goal: Will remain free from infection Outcome: Progressing Goal: Diagnostic test results will improve Outcome: Progressing Goal: Cardiovascular complication will be avoided Outcome: Progressing

## 2024-10-21 NOTE — Progress Notes (Signed)
 Transition of Care [facility]) - Inpatient Brief Assessment  Patient Details  Name: Sherry Olsen MRN: [id number] Date of Birth: 05/06/1933  Transition of [facility]) CM/SW Contact:    Nena LITTIE Coffee, RN Phone Number: 10/21/2024, 12:45 PM  Clinical Narrative: 89 y/o female c/persistent abd pain, constipation. Lives alone c/children nearby and able to assist as needed. Drives short distances. Plans to return home at dc c/one of her daughters providing transportation.   Current DME: walking stick  Transition of Care Department (TOC) has reviewed patient and will continue to monitor patient advancement.  If new patient needs arise, please place a TOC consult.  Transition of Care Asessment: Insurance and Status: (P) Insurance coverage has been reviewed Patient has primary care physician: (P) Yes Home environment has been reviewed: (P) From home alone Prior level of function:: (P) Independent Prior/Current Home Services: (P) No current home services Social Drivers of Health Review: (P) SDOH reviewed no interventions necessary Readmission risk has been reviewed: (P) Yes Transition of care needs: (P) no transition of care needs at this time

## 2024-10-21 NOTE — Progress Notes (Signed)
 "        [facility]                                                                               Sherry Olsen, is a 89 y.o. female, DOB - [birth year], FMW:[id number] Admit date - 10/20/2024    Outpatient Primary MD for the patient is Sherry Tinnie Norris, PA-C  LOS - 0  days    Brief summary   Sherry Olsen is a 89 y.o. female with medical history significant of CAD, HTN, cystocele with rectocele, GERD, IBS D/C, hypothyroidism (postoperative s/p thyroid  cancer), BPPV, anxiety who presented to [facility] ED for evaluation and persistent abdominal pain and inability to have a bowel movement.  Pt was seen in the ED on 1/9 for same, underwent CT abdomen/pelvis that was non-acute, showed some right colonic fecal load, diverticulosis without diverticulitis.  Pt was able to be discharged home on a bowel regimen and advised to push fluids.  Since that time, patient has been having progressive abdominal pain that worsens any time     Assessment & Plan    Assessment and Plan: * Abdominal pain Fecal impaction / Constipation Enteritis and possible Ileus Hx of IBS C&D - follows with GI at [facility], Dr. Candi. CT abdomen/pelvis showing diffuse enteritis, possible ileus, fecal impaction. --Continue bowel reigmen - Miralax  BID, Senna-S BID, PRN sorbitol , Fleet enema daily PRN --Monitor abdominal exam --Maintenance IV fluids --Resume home Levsin  PRN, Simethicone  PRN --Full liquids as tolerated for now -- make NPO in having increased distention or nausea/vomiting develop.   -    Hyponatremia Sodium 129 on admission, in setting of poor PO intake, likely hypovolemic. Improving .    Acquired hypothyroidism S/p resection of primary thyroid  cancer. --Continue Synthroid   Cystocele with rectocele Chronic.  Complicates current acute issue and fecal impaction.  Most recent Urology note reviewed.   CT did show fecal impaction with stool within the prolapsed rectum.    Mgmt as  above.  Essential hypertension Well controlled.   Hypokalemia Replaced.     Estimated body mass index is 20.46 kg/m as calculated from the following:   Height as of this encounter: 5' 0.07 (1.526 m).   Weight as of this encounter: 47.6 kg.  Code Status: full code.  DVT Prophylaxis:  enoxaparin  (LOVENOX ) injection 30 mg Start: 10/20/24 1800   Level of Care: Level of care: Med-Surg Family Communication: Updated patient's daughter over the phone.   Disposition Plan:     Remains inpatient appropriate:  pending.   Procedures:  None.   Consultants:   None.   Antimicrobials:   Anti-infectives (From admission, onward)    None        Medications  Scheduled Meds:  atorvastatin   5 mg Oral QPM   enoxaparin  (LOVENOX ) injection  30 mg Subcutaneous Q24H   levothyroxine   75 mcg Oral Q0600   losartan   25 mg Oral Daily   polyethylene glycol  17 g Oral BID   potassium chloride   40 mEq Oral BID   senna-docusate  1 tablet Oral BID   Continuous Infusions:  sodium chloride  Stopped (10/20/24 1701)   PRN Meds:.acetaminophen  **OR** acetaminophen , hyoscyamine , ondansetron  **OR**  ondansetron  (ZOFRAN ) IV, simethicone , sodium phosphate , sorbitol     Subjective:   Sherry Olsen was seen and examined today.  Reports having 2 bowel movements and an enema earlier.  No nausea, vomiting or abdominal pain.   Objective:   Vitals:   10/20/24 1953 10/20/24 2306 10/21/24 0449 10/21/24 0700  BP: (!) 143/70 (!) 160/88 (!) 140/76 138/70  Pulse: 81 88 77 64  Resp: 17 16 16    Temp: 98.2 F (36.8 C) 98.7 F (37.1 C) 98.6 F (37 C) 97.6 F (36.4 C)  TempSrc:   Oral Oral  SpO2: 97% 96% 93% 96%  Weight:      Height:        Intake/Output Summary (Last 24 hours) at 10/21/2024 1425 Last data filed at 10/20/2024 1814 Gross per 24 hour  Intake 80.05 ml  Output --  Net 80.05 ml   Filed Weights   10/20/24 0715  Weight: 47.6 kg     Exam General: Alert and oriented x 3,  NAD Cardiovascular: S1 S2 auscultated, no murmurs, RRR Respiratory: Clear to auscultation bilaterally, no wheezing, rales or rhonchi Gastrointestinal: Soft, nontender, nondistended, + bowel sounds Ext: no pedal edema bilaterally Neuro: AAOx3, Cr N's II- XII Skin: No rashes Psych: Normal affect and demeanor, alert and oriented x3    Data Reviewed:  I have personally reviewed following labs and imaging studies   CBC Lab Results  Component Value Date   WBC 5.2 10/20/2024   RBC 4.96 10/20/2024   HGB 14.2 10/20/2024   HCT 42.8 10/20/2024   MCV 86.3 10/20/2024   MCH 28.6 10/20/2024   PLT 261 10/20/2024   MCHC 33.2 10/20/2024   RDW 13.3 10/20/2024   LYMPHSABS 1.5 05/25/2022   MONOABS 0.4 05/25/2022   EOSABS 0.1 05/25/2022   BASOSABS 0.0 05/25/2022     Last metabolic panel Lab Results  Component Value Date   NA 133 (L) 10/21/2024   K 3.0 (L) 10/21/2024   CL 96 (L) 10/21/2024   CO2 25 10/21/2024   BUN 8 10/21/2024   CREATININE 0.58 10/21/2024   GLUCOSE 86 10/21/2024   GFRNONAA >60 10/21/2024   GFRAA >90 02/03/2015   CALCIUM  8.7 (L) 10/21/2024   PROT 7.4 10/20/2024   ALBUMIN 4.7 10/20/2024   BILITOT 0.8 10/20/2024   ALKPHOS 114 10/20/2024   AST 34 10/20/2024   ALT 25 10/20/2024   ANIONGAP 11 10/21/2024    CBG (last 3)  No results for input(s): GLUCAP in the last 72 hours.    Coagulation Profile: No results for input(s): INR, PROTIME in the last 168 hours.   Radiology Studies: DG Abd Portable 1V Result Date: 10/21/2024 EXAM: 1 VIEW XRAY OF THE ABDOMEN 10/21/2024 07:29:25 AM COMPARISON: 10/20/2024 CLINICAL HISTORY: Abdominal pain FINDINGS: BOWEL: Similar mild gaseous distension of both large and small bowel loops. Similar mild stool burden in the right colon. SOFT TISSUES: Cholecystectomy clips present. Atherosclerotic calcifications of the abdominal aorta. BONES: Lumbar scoliosis and degenerative changes. IMPRESSION: 1. No acute findings. 2. Unchanged  appearance of mild gaseous distention of the large and small bowel loops without signs of high-grade mechanical obstruction. Electronically signed by: Waddell Calk MD MD 10/21/2024 07:42 AM EST RP Workstation: [id number]   CT ABDOMEN PELVIS W CONTRAST Result Date: 10/20/2024 EXAM: CT ABDOMEN AND PELVIS WITH CONTRAST 10/20/2024 09:39:29 AM TECHNIQUE: CT of the abdomen and pelvis was performed with the administration of 70 mL of iohexol  (OMNIPAQUE ) 300 MG/ML solution. Multiplanar reformatted images are provided for review. Automated exposure control,  iterative reconstruction, and/or weight-based adjustment of the mA/kV was utilized to reduce the radiation dose to as low as reasonably achievable. COMPARISON: CT abdomen and pelvis 10/18/2024 and earlier. CLINICAL HISTORY: 89 year old female with progressive abdominal pain. Suspected bowel obstruction. FINDINGS: LOWER CHEST: Stable mild cardiomegaly. Stable mild lung base atelectasis and scarring. Stable mild elevation of the right hemidiaphragm, probable normal variant. LIVER: The liver is unremarkable. GALLBLADDER AND BILE DUCTS: Chronic cholecystectomy. Stable postoperative bile duct appearance. SPLEEN: No acute abnormality. PANCREAS: No acute abnormality. ADRENAL GLANDS: No acute abnormality. KIDNEYS, URETERS AND BLADDER: Symmetric renal contrast and excretion. No stones in the kidneys or ureters. No hydronephrosis. No perinephric or periureteral stranding. Mildly distended but otherwise unremarkable urinary bladder. GI AND BOWEL: Stomach and duodenum are decompressed. Pelvic floor laxity and progressive rectal stool ball (series 2 image 73). Upstream proximal rectum and distal sigmoid colon in the pelvis are mostly decompressed. Questionable generalized hyperenhancement of large and small bowel mucosa, including in the decompressed rectum on series 2 image 65. Descending colon is decompressed. Transverse colon is redundant. Similar redundant right colon with  retained stool. Cecum mostly located in the pelvis. Decompressed terminal ileum. Appendix not identified. Widespread gas and fluid containing small bowel loops which are generally 2 cm diameter (approaching the upper limits of normal). No evidence of mechanical bowel obstruction. PERITONEUM AND RETROPERITONEUM: No discrete mesenteric inflammation. No pneumoperitoneum or free fluid. VASCULATURE: Aorta is normal in caliber. Advanced calcified atherosclerosis in the abdomen and pelvis. Major arterial structures and portal venous system are patent. LYMPH NODES: No lymphadenopathy. REPRODUCTIVE ORGANS: Chronically absent uterus, diminutive or absent ovaries. BONES AND SOFT TISSUES: Chronic spine degeneration and levoconvex scoliosis. No acute osseous abnormality. No focal soft tissue abnormality. IMPRESSION: 1. Generalized enteritis and ileus are difficult to exclude, but no evidence of mechanical bowel obstruction. But highly redundant large bowel with retained stool, including in prolapsed rectum; consider developing fecal impaction. 2. No other acute or inflammatory process identified. Advanced atherosclerosis. Electronically signed by: Helayne Hurst MD MD 10/20/2024 09:50 AM EST RP Workstation: [id number]   DG Abd Portable 1 View Result Date: 10/20/2024 EXAM: 1 VIEW XRAY OF THE ABDOMEN 10/20/2024 08:33:52 AM COMPARISON: CT abdomen and pelvis 10/18/2024. CLINICAL HISTORY: 89 year old female. Constipation. FINDINGS: BOWEL: Mildly prominent small bowel loops throughout the abdomen is increased. Moderate colonic stool burden, including conspicuous stool ball in the rectum . SOFT TISSUES: Right upper quadrant surgical clips noted. Atherosclerotic plaque noted. BONES: Levoscoliosis and degenerative changes of the spine. No acute fracture. No acute osseous abnormality. IMPRESSION: 1. From recent CT, increased mild small-bowel prominence and moderate stool burden. Consider milder developing ileus in addition to rectal fecal  impaction. Electronically signed by: Helayne Hurst MD MD 10/20/2024 08:48 AM EST RP Workstation: [id number]       Elgie Butter M.D. [facility] 10/21/2024, 2:25 PM  Available via Epic secure chat 7am-7pm After 7 pm, please refer to night coverage provider listed on amion.    "

## 2024-10-21 NOTE — Plan of Care (Signed)

## 2024-10-22 ENCOUNTER — Observation Stay (HOSPITAL_COMMUNITY)

## 2024-10-22 DIAGNOSIS — E039 Hypothyroidism, unspecified: Secondary | ICD-10-CM | POA: Diagnosis not present

## 2024-10-22 DIAGNOSIS — I10 Essential (primary) hypertension: Secondary | ICD-10-CM | POA: Diagnosis not present

## 2024-10-22 DIAGNOSIS — E871 Hypo-osmolality and hyponatremia: Secondary | ICD-10-CM | POA: Diagnosis not present

## 2024-10-22 DIAGNOSIS — R1084 Generalized abdominal pain: Secondary | ICD-10-CM | POA: Diagnosis not present

## 2024-10-22 DIAGNOSIS — N8110 Cystocele, unspecified: Secondary | ICD-10-CM | POA: Diagnosis not present

## 2024-10-22 DIAGNOSIS — R109 Unspecified abdominal pain: Secondary | ICD-10-CM | POA: Diagnosis not present

## 2024-10-22 DIAGNOSIS — E876 Hypokalemia: Secondary | ICD-10-CM | POA: Diagnosis not present

## 2024-10-22 DIAGNOSIS — N816 Rectocele: Secondary | ICD-10-CM | POA: Diagnosis not present

## 2024-10-22 LAB — BASIC METABOLIC PANEL WITH GFR
Anion gap: 8 (ref 5–15)
BUN: <5 mg/dL — ABNORMAL LOW (ref 8–23)
CO2: 26 mmol/L (ref 22–32)
Calcium: 8.6 mg/dL — ABNORMAL LOW (ref 8.9–10.3)
Chloride: 97 mmol/L — ABNORMAL LOW (ref 98–111)
Creatinine, Ser: 0.54 mg/dL (ref 0.44–1.00)
GFR, Estimated: >60 mL/min
Glucose, Bld: 85 mg/dL (ref 70–99)
Potassium: 3.7 mmol/L (ref 3.5–5.1)
Sodium: 131 mmol/L — ABNORMAL LOW (ref 135–145)

## 2024-10-22 LAB — TSH: TSH: 1.63 u[IU]/mL (ref 0.350–4.500)

## 2024-10-22 MED ORDER — HYDROXYZINE HCL 25 MG PO TABS
25.0000 mg | ORAL_TABLET | Freq: Three times a day (TID) | ORAL | Status: DC | PRN
  Administered 2024-10-22 – 2024-10-23 (×2): 25 mg via ORAL
  Filled 2024-10-22 (×2): qty 1

## 2024-10-22 MED ORDER — LOSARTAN POTASSIUM 25 MG PO TABS
12.5000 mg | ORAL_TABLET | Freq: Every day | ORAL | Status: DC
  Administered 2024-10-23: 12.5 mg via ORAL
  Filled 2024-10-22: qty 1

## 2024-10-22 NOTE — Progress Notes (Signed)
 "        [facility]                                                                               Sherry Olsen, is a 89 y.o. female, DOB - Oct 18, 1932, FMW:[id number] Admit date - 10/20/2024    Outpatient Primary MD for the patient is Evangelina Tinnie Norris, PA-C  LOS - 0  days    Brief summary   Sherry Olsen is a 89 y.o. female with medical history significant of CAD, HTN, cystocele with rectocele, GERD, IBS D/C, hypothyroidism (postoperative s/p thyroid  cancer), BPPV, anxiety who presented to [facility] ED for evaluation and persistent abdominal pain and inability to have a bowel movement.  Pt was seen in the ED on 1/9 for same, underwent CT abdomen/pelvis that was non-acute, showed some right colonic fecal load, diverticulosis without diverticulitis.  Pt was able to be discharged home on a bowel regimen and advised to push fluids.  Since that time, patient has been having progressive abdominal pain that worsens any time     Assessment & Plan    Assessment and Plan: * Abdominal pain Fecal impaction / Constipation Enteritis and possible Ileus Hx of IBS C&D - follows with GI at [facility], Dr. Candi. CT abdomen/pelvis showing diffuse enteritis, possible ileus, fecal impaction. Continue with bowel regimen.  Advanced to regular diet today.  Repeat abdominal x ray ordered .    Hyponatremia Sodium 129 on admission, in setting of poor PO intake, likely hypovolemic. Sodium has improved to 131.  Monitor.    Acquired hypothyroidism S/p resection of primary thyroid  cancer. --Continue Synthroid   Cystocele with rectocele Chronic.  Complicates current acute issue and fecal impaction.  Most recent Urology note reviewed.   CT did show fecal impaction with stool within the prolapsed rectum.    Mgmt as above.  Essential hypertension Optimal. Patient apparently takes only 12.5 mg of losartan .   Hypokalemia Replaced.   Hyperlipidemia Resume lipitor 5 mg daily.       Estimated body mass index is 20.46 kg/m as calculated from the following:   Height as of this encounter: 5' 0.07 (1.526 m).   Weight as of this encounter: 47.6 kg.  Code Status: full code.  DVT Prophylaxis:  enoxaparin  (LOVENOX ) injection 30 mg Start: 10/20/24 1800   Level of Care: Level of care: Med-Surg Family Communication: discussed the plan with the daughter at bedside.   Disposition Plan:     Remains inpatient appropriate:  pending. Possible d/c in am.   Procedures:  None.   Consultants:   None.   Antimicrobials:   Anti-infectives (From admission, onward)    None        Medications  Scheduled Meds:  atorvastatin   5 mg Oral QPM   enoxaparin  (LOVENOX ) injection  30 mg Subcutaneous Q24H   levothyroxine   75 mcg Oral Q0600   [START ON 10/23/2024] losartan   12.5 mg Oral Daily   polyethylene glycol  17 g Oral BID   senna-docusate  1 tablet Oral BID   Continuous Infusions:   PRN Meds:.acetaminophen  **OR** acetaminophen , hydrOXYzine , hyoscyamine , ondansetron  **OR** ondansetron  (ZOFRAN ) IV, simethicone , sodium phosphate , sorbitol     Subjective:  Sherry Olsen was seen and examined today. Having small bowel movements. No abdominal pain. No fever or chills. No nausea vomiting.   Objective:   Vitals:   10/21/24 2100 10/22/24 0500 10/22/24 0819 10/22/24 1216  BP: (!) 159/62 (!) 170/72 137/63 126/68  Pulse: 63 65 73 66  Resp: 17 17 18 18   Temp: 97.8 F (36.6 C) 97.8 F (36.6 C) 98 F (36.7 C) 98.1 F (36.7 C)  TempSrc: Oral Oral Oral Oral  SpO2: 99% 98% 96% 96%  Weight:      Height:        Intake/Output Summary (Last 24 hours) at 10/22/2024 1511 Last data filed at 10/22/2024 0500 Gross per 24 hour  Intake 360 ml  Output --  Net 360 ml   Filed Weights   10/20/24 0715  Weight: 47.6 kg     Exam General exam: Appears calm and comfortable  Respiratory system: Clear to auscultation. Respiratory effort normal. Cardiovascular system: S1 &  S2 heard, RRR.  Gastrointestinal system: Abdomen is soft, bs+ Central nervous system: Alert and oriented.  Extremities: no pedal edema.  Skin: No rashes,  Psychiatry:slightly anxious.    Data Reviewed:  I have personally reviewed following labs and imaging studies   CBC Lab Results  Component Value Date   WBC 5.2 10/20/2024   RBC 4.96 10/20/2024   HGB 14.2 10/20/2024   HCT 42.8 10/20/2024   MCV 86.3 10/20/2024   MCH 28.6 10/20/2024   PLT 261 10/20/2024   MCHC 33.2 10/20/2024   RDW 13.3 10/20/2024   LYMPHSABS 1.5 05/25/2022   MONOABS 0.4 05/25/2022   EOSABS 0.1 05/25/2022   BASOSABS 0.0 05/25/2022     Last metabolic panel Lab Results  Component Value Date   NA 131 (L) 10/22/2024   K 3.7 10/22/2024   CL 97 (L) 10/22/2024   CO2 26 10/22/2024   BUN <5 (L) 10/22/2024   CREATININE 0.54 10/22/2024   GLUCOSE 85 10/22/2024   GFRNONAA >60 10/22/2024   GFRAA >90 02/03/2015   CALCIUM  8.6 (L) 10/22/2024   PROT 7.4 10/20/2024   ALBUMIN 4.7 10/20/2024   BILITOT 0.8 10/20/2024   ALKPHOS 114 10/20/2024   AST 34 10/20/2024   ALT 25 10/20/2024   ANIONGAP 8 10/22/2024    CBG (last 3)  Recent Labs    10/21/24 2131  GLUCAP 99      Coagulation Profile: No results for input(s): INR, PROTIME in the last 168 hours.   Radiology Studies: DG Abd Portable 1V Result Date: 10/21/2024 EXAM: 1 VIEW XRAY OF THE ABDOMEN 10/21/2024 07:29:25 AM COMPARISON: 10/20/2024 CLINICAL HISTORY: Abdominal pain FINDINGS: BOWEL: Similar mild gaseous distension of both large and small bowel loops. Similar mild stool burden in the right colon. SOFT TISSUES: Cholecystectomy clips present. Atherosclerotic calcifications of the abdominal aorta. BONES: Lumbar scoliosis and degenerative changes. IMPRESSION: 1. No acute findings. 2. Unchanged appearance of mild gaseous distention of the large and small bowel loops without signs of high-grade mechanical obstruction. Electronically signed by: Waddell Calk  MD MD 10/21/2024 07:42 AM EST RP Workstation: [id number]       Elgie Butter M.D. [facility] 10/22/2024, 3:11 PM  Available via Epic secure chat 7am-7pm After 7 pm, please refer to night coverage provider listed on amion.    "

## 2024-10-22 NOTE — Evaluation (Signed)
 Physical Therapy Evaluation Patient Details Name: Sherry Olsen MRN: [id number] DOB: November 08, 1932 Today's Date: 10/22/2024  History of Present Illness  89 y.o. female presents to [facility] hospital on 10/20/2024 with persistent abdominal pain and constipation. Pt admitted for management of diffuse enteritis, possible ileus and fecal impaction. PMH includes CAD, HTN, cystocele with rectocele, GERD, IBS, hypothyroidism, BPPV, anxiety.  Clinical Impression  Pt in bed upon arrival of PT, agreeable to evaluation at this time. Prior to admission the pt reports she was ambulating in her home without DME, using a ski pole for walking outdoors, denies recent falls, and is independent with driving and ADLs/IADLs. The pt does have a local daughter who is able to assist as needed. The pt was able to complete bed mobility and transfers without assistance, but benefits from up to minA to steady with balance challenge during gait. Pt with x3 near LOB needing minA to recover all related to balance challenge (stepping over an object, turning quickly, head movements) and reports that this happens to her intermittently at home as well. The pt is reluctant to increase support through DME for mobility, will benefit from continued skilled PT acutely and OPPT after d/c to progress dynamic stability and reduce risk of falls at home.     If plan is discharge home, recommend the following: A little help with walking and/or transfers;A little help with bathing/dressing/bathroom;Assistance with cooking/housework;Assist for transportation;Help with stairs or ramp for entrance   Can travel by private vehicle        Equipment Recommendations None recommended by PT  Recommendations for Other Services       Functional Status Assessment Patient has had a recent decline in their functional status and demonstrates the ability to make significant improvements in function in a reasonable and predictable amount of time.     Precautions /  Restrictions Precautions Precautions: Fall Recall of Precautions/Restrictions: Intact Restrictions Weight Bearing Restrictions Per Provider Order: No      Mobility  Bed Mobility Overal bed mobility: Needs Assistance Bed Mobility: Supine to Sit     Supine to sit: Supervision     General bed mobility comments: no assist, slightly increased time    Transfers Overall transfer level: Needs assistance Equipment used: None Transfers: Sit to/from Stand Sit to Stand: Supervision           General transfer comment: supervision with stand, no increased sway    Ambulation/Gait Ambulation/Gait assistance: Contact guard assist, Min assist Gait Distance (Feet): 400 Feet Assistive device: None Gait Pattern/deviations: Step-through pattern, Decreased stride length, Drifts right/left Gait velocity: WFL Gait velocity interpretation: 1.31 - 2.62 ft/sec, indicative of limited community ambulator   General Gait Details: pt with good mobility with unchallenged gait, with any turns, head movements, or changes in surface, pt with slowed speed and increased caution. x3 near LOB cauing pt to stop and widen BOS, minA given during these episodes, all occuring with quick turns. pt refusing to use RW     Balance Overall balance assessment: Needs assistance Sitting-balance support: No upper extremity supported, Feet supported Sitting balance-Leahy Scale: Normal     Standing balance support: No upper extremity supported, During functional activity Standing balance-Leahy Scale: Fair Standing balance comment: poor tolerance of balance challenge     Tandem Stance - Right Leg:  (unable to achieve position for any amount of time without assist) Tandem Stance - Left Leg:  (unable to achieve position for any amount of time without assist) Rhomberg - Eyes Opened: 15 Rhomberg -  Eyes Closed: 10 (significant increased sway) High level balance activites: Direction changes, Turns, Head turns High Level  Balance Comments: up to minA, poor tolerance of challenge Standardized Balance Assessment Standardized Balance Assessment : Dynamic Gait Index   Dynamic Gait Index Level Surface: Mild Impairment Change in Gait Speed: Normal Gait with Horizontal Head Turns: Moderate Impairment Gait with Vertical Head Turns: Moderate Impairment Gait and Pivot Turn: Moderate Impairment Step Over Obstacle: Moderate Impairment Step Around Obstacles: Mild Impairment Steps: Mild Impairment Total Score: 13       Pertinent Vitals/Pain Pain Assessment Pain Assessment: Faces Pain Score: 2  Faces Pain Scale: Hurts a little bit Pain Location: abdomen Pain Descriptors / Indicators: Discomfort Pain Intervention(s): Limited activity within patient's tolerance, Monitored during session, Repositioned    Home Living Family/patient expects to be discharged to:: Private residence Living Arrangements: Alone Available Help at Discharge: Family;Available PRN/intermittently Type of Home: House Home Access: Stairs to enter Entrance Stairs-Rails: Doctor, General Practice of Steps: 6   Home Layout: One level Home Equipment: Grab bars - tub/shower (ski pole (walking stick for outside), access to a rollator)      Prior Function Prior Level of Function : Independent/Modified Independent;Driving             Mobility Comments: pt denies falls but describes waves of near loss of balance. this has happened x3 in last 6 months. uses ski pole for walking outside ADLs Comments: independent, driving     Extremity/Trunk Assessment   Upper Extremity Assessment Upper Extremity Assessment: Overall WFL for tasks assessed    Lower Extremity Assessment Lower Extremity Assessment: Generalized weakness    Cervical / Trunk Assessment Cervical / Trunk Assessment: Kyphotic;Other exceptions Cervical / Trunk Exceptions: prior cervical fusion  Communication   Communication Communication: Impaired Factors  Affecting Communication: Hearing impaired    Cognition Arousal: Alert Behavior During Therapy: WFL for tasks assessed/performed   PT - Cognitive impairments: Attention, Problem solving, Safety/Judgement                       PT - Cognition Comments: tangential at times, but able to follow commands Following commands: Intact       Cueing Cueing Techniques: Verbal cues     General Comments      Exercises     Assessment/Plan    PT Assessment Patient needs continued PT services  PT Problem List Decreased strength;Decreased balance;Decreased activity tolerance;Decreased mobility;Decreased safety awareness       PT Treatment Interventions DME instruction;Gait training;Stair training;Functional mobility training;Therapeutic activities;Therapeutic exercise;Balance training;Patient/family education    PT Goals (Current goals can be found in the Care Plan section)  Acute Rehab PT Goals Patient Stated Goal: to return home, figure out balance issues PT Goal Formulation: With patient Time For Goal Achievement: 11/05/24 Potential to Achieve Goals: Good    Frequency Min 2X/week        AM-PAC PT 6 Clicks Mobility  Outcome Measure Help needed turning from your back to your side while in a flat bed without using bedrails?: None Help needed moving from lying on your back to sitting on the side of a flat bed without using bedrails?: None Help needed moving to and from a bed to a chair (including a wheelchair)?: A Little Help needed standing up from a chair using your arms (e.g., wheelchair or bedside chair)?: A Little Help needed to walk in hospital room?: A Little Help needed climbing 3-5 steps with a railing? : A Little 6 Click  Score: 20    End of Session Equipment Utilized During Treatment: Gait belt Activity Tolerance: Patient tolerated treatment well Patient left: in bed;with call bell/phone within reach;with family/visitor present Nurse Communication: Mobility  status PT Visit Diagnosis: Unsteadiness on feet (R26.81);Muscle weakness (generalized) (M62.81);Difficulty in walking, not elsewhere classified (R26.2)    Time: 8598-8568 PT Time Calculation (min) (ACUTE ONLY): 30 min   Charges:   PT Evaluation $PT Eval Low Complexity: 1 Low PT Treatments $Therapeutic Exercise: 8-22 mins PT General Charges $$ ACUTE PT VISIT: 1 Visit         Izetta Call, PT, DPT   Acute Rehabilitation Department Office [phone] Secure Chat Communication Preferred   Izetta JULIANNA Call 10/22/2024, 4:10 PM

## 2024-10-22 NOTE — Plan of Care (Signed)

## 2024-10-22 NOTE — TOC Transition Note (Signed)
 Transition of Care [facility]) - Discharge Note  Patient Details  Name: Sherry Olsen MRN: [id number] Date of Birth: 19-Mar-1933  [facility] CM/SW Contact:  Nena LITTIE Coffee, RN Phone Number: 10/22/2024, 6:21 PM  Clinical Narrative:    CM discussed outpt PT c/pt and daughter.  Referral to [facility] at [facility]. AVS updated.   Final next level of care: OP Rehab Barriers to Discharge: Continued Medical Work up  Patient Goals and CMS Choice  Choice offered to / list presented to : Patient, Adult Children   Social Drivers of Health (SDOH) Interventions SDOH Screenings   Food Insecurity: No Food Insecurity (10/20/2024)  Housing: Low Risk (10/20/2024)  Transportation Needs: No Transportation Needs (10/20/2024)  Utilities: Not At Risk (10/20/2024)  Social Connections: Moderately Integrated (10/20/2024)  Tobacco Use: Low Risk (10/20/2024)   Readmission Risk Interventions     No data to display

## 2024-10-23 DIAGNOSIS — R1084 Generalized abdominal pain: Secondary | ICD-10-CM | POA: Diagnosis not present

## 2024-10-23 DIAGNOSIS — R109 Unspecified abdominal pain: Secondary | ICD-10-CM | POA: Diagnosis not present

## 2024-10-23 LAB — CBC WITH DIFFERENTIAL/PLATELET
Abs Immature Granulocytes: 0.02 K/uL (ref 0.00–0.07)
Basophils Absolute: 0 K/uL (ref 0.0–0.1)
Basophils Relative: 0 %
Eosinophils Absolute: 0.1 K/uL (ref 0.0–0.5)
Eosinophils Relative: 2 %
HCT: 46.4 % — ABNORMAL HIGH (ref 36.0–46.0)
Hemoglobin: 15.2 g/dL — ABNORMAL HIGH (ref 12.0–15.0)
Immature Granulocytes: 0 %
Lymphocytes Relative: 18 %
Lymphs Abs: 1.3 K/uL (ref 0.7–4.0)
MCH: 28.5 pg (ref 26.0–34.0)
MCHC: 32.8 g/dL (ref 30.0–36.0)
MCV: 86.9 fL (ref 80.0–100.0)
Monocytes Absolute: 0.5 K/uL (ref 0.1–1.0)
Monocytes Relative: 7 %
Neutro Abs: 5.2 K/uL (ref 1.7–7.7)
Neutrophils Relative %: 73 %
Platelets: 300 K/uL (ref 150–400)
RBC: 5.34 MIL/uL — ABNORMAL HIGH (ref 3.87–5.11)
RDW: 13.6 % (ref 11.5–15.5)
WBC: 7.2 K/uL (ref 4.0–10.5)
nRBC: 0 % (ref 0.0–0.2)

## 2024-10-23 LAB — BASIC METABOLIC PANEL WITH GFR
Anion gap: 10 (ref 5–15)
BUN: 5 mg/dL — ABNORMAL LOW (ref 8–23)
CO2: 29 mmol/L (ref 22–32)
Calcium: 9.4 mg/dL (ref 8.9–10.3)
Chloride: 94 mmol/L — ABNORMAL LOW (ref 98–111)
Creatinine, Ser: 0.73 mg/dL (ref 0.44–1.00)
GFR, Estimated: >60 mL/min
Glucose, Bld: 100 mg/dL — ABNORMAL HIGH (ref 70–99)
Potassium: 4.3 mmol/L (ref 3.5–5.1)
Sodium: 133 mmol/L — ABNORMAL LOW (ref 135–145)

## 2024-10-23 NOTE — Discharge Summary (Signed)
 " Physician Discharge Summary   Patient: Sherry Olsen MRN: [id number] DOB: Feb 17, 1933  Admit date:     10/20/2024  Discharge date: 10/23/2024  Discharge Physician: Deliliah Room   PCP: Evangelina Tinnie Norris, PA-C   Recommendations at discharge:    F/u with your PCP in one week.  Discharge Diagnoses: Principal Problem:   Abdominal pain Active Problems:   Hyponatremia   Cystocele with rectocele   Acquired hypothyroidism   Essential hypertension   Hospital Course: 89 y.o. female with medical history significant of CAD, HTN, cystocele with rectocele, GERD, IBS D/C, hypothyroidism (postoperative s/p thyroid  cancer), BPPV, anxiety who presented to [facility] ED for evaluation and persistent abdominal pain and inability to have a bowel movement.  Pt was seen in the ED on 1/9 for same, underwent CT abdomen/pelvis that was non-acute, showed some right colonic fecal load, diverticulosis without diverticulitis.  Pt was able to be discharged home on a bowel regimen and advised to push fluids.  Since that time, patient has been having progressive worsening abdominal pain.  Abdominal pain, resolved Fecal impaction / Constipation, resolved Hx of IBS C&D - follows with GI at [facility], Dr. Candi. CT abdomen/pelvis showing diffuse enteritis, possible ileus, fecal impaction. Continue with bowel regimen.  Advanced to regular diet, tolerating well  Repeat abdominal x ray is unremarkable.     Hyponatremia Sodium 129 on admission,improved to 133 on the discharge day.     Acquired hypothyroidism S/p resection of primary thyroid  cancer. --Continue Synthroid    Cystocele with rectocele Chronic.  Complicates current acute issue and fecal impaction.  Most recent Urology note reviewed.   CT did show fecal impaction with stool within the prolapsed rectum.       Essential hypertension Optimal. Patient apparently takes only 12.5 mg of losartan .    Hypokalemia Replaced.     Hyperlipidemia Resumed  lipitor 5 mg daily.   Disposition: Home with Outpatient PT      Consultants: None Procedures performed: None  Disposition: Home Diet recommendation:  Regular diet DISCHARGE MEDICATION: Allergies as of 10/23/2024       Reactions   Penicillins Anaphylaxis   Ciprofloxacin Hives   Citalopram Other (See Comments)   Other Reaction(s): other Hearing musical notes that are not there citalopram   Fosamax [alendronate Sodium] Other (See Comments)   Sign of heart attack   Ibuprofen    Cannot take due to hemorraging    Keflex [cephalexin] Hives   Lactose Intolerance (gi) Diarrhea   Linaclotide Other (See Comments)   Other Reaction(s): other Shaky, weak linaclotide   Milk-related Compounds Diarrhea   Other    NO DAIRY OR SOY - diarrhea/cramping.   Prilosec [omeprazole] Hives   Sulfa Antibiotics Hives   Tetracyclines & Related Hives, Other (See Comments)   Minocycline - could hardly talk   Zinc         Medication List     STOP taking these medications    diazepam  5 MG tablet Commonly known as: VALIUM    estradiol  0.01 % Crea vaginal cream Commonly known as: ESTRACE    ondansetron  4 MG tablet Commonly known as: ZOFRAN    oseltamivir 75 MG capsule Commonly known as: TAMIFLU   promethazine -dextromethorphan 6.25-15 MG/5ML syrup Commonly known as: PROMETHAZINE -DM       TAKE these medications    acetaminophen  500 MG tablet Commonly known as: TYLENOL  Take 500 mg by mouth at bedtime as needed for moderate pain (pain score 4-6) or mild pain (pain score 1-3).  atorvastatin  10 MG tablet Commonly known as: LIPITOR Take 5 mg by mouth every evening.   calcium  citrate-vitamin D  315-200 MG-UNIT tablet Commonly known as: CITRACAL+D Take 1 tablet by mouth daily.   cyanocobalamin 500 MCG tablet Commonly known as: VITAMIN B12 Take 500 mcg by mouth every other day.   hyoscyamine  0.125 MG tablet Commonly known as: LEVSIN  TAKE 1 TABLET BY MOUTH EVERY 6 HOURS AS NEEDED  FOR CRAMPING.   levothyroxine  75 MCG tablet Commonly known as: SYNTHROID  Take by mouth.   losartan  25 MG tablet Commonly known as: COZAAR  Take 6.25 mg by mouth in the morning and at bedtime.   Myrbetriq 50 MG Tb24 tablet Generic drug: mirabegron ER Take 1 tablet by mouth daily.   OCULAR VITAMINS PO Take 1 tablet by mouth 2 (two) times daily.   Omega-3 1000 MG Caps Take 1,000 mg by mouth daily.   polyethylene glycol 17 g packet Commonly known as: MIRALAX  / GLYCOLAX  Take 8.5 g by mouth at bedtime.   Vitamin D3 50 MCG (2000 UT) capsule Take 2,000 Units by mouth daily.        Follow-up Information     Evangelina Tinnie Norris, PA-C. Schedule an appointment as soon as possible for a visit in 1 week(s).   Specialty: Internal Medicine Contact information: [address] [place] [address] [phone]                Discharge Exam: Filed Weights   10/20/24 0715  Weight: 47.6 kg   Constitutional: NAD, calm, comfortable Eyes: PERRL, lids and conjunctivae normal ENMT: Mucous membranes are moist. Posterior pharynx clear of any exudate or lesions.Normal dentition.  Neck: normal, supple, no masses, no thyromegaly Respiratory: clear to auscultation bilaterally, no wheezing, no crackles. Normal respiratory effort. No accessory muscle use.  Cardiovascular: Regular rate and rhythm, no murmurs / rubs / gallops. No extremity edema. 2+ pedal pulses. No carotid bruits.  Abdomen: no tenderness, no masses palpated. No hepatosplenomegaly. Bowel sounds positive.  Musculoskeletal: no clubbing / cyanosis. No joint deformity upper and lower extremities. Good ROM, no contractures. Normal muscle tone.  Skin: no rashes, lesions, ulcers. No induration Neurologic: CN 2-12 grossly intact. Sensation intact, DTR normal. Strength 5/5 x all 4 extremities.  Psychiatric: Normal judgment and insight. Alert and oriented x 3. Normal mood.    Condition at discharge: good  The results of  significant diagnostics from this hospitalization (including imaging, microbiology, ancillary and laboratory) are listed below for reference.   Imaging Studies: DG Abd Portable 1V Result Date: 10/22/2024 CLINICAL DATA:  Abdominal pain. EXAM: PORTABLE ABDOMEN - 1 VIEW COMPARISON:  Abdominal radiograph dated 10/21/2024. FINDINGS: No bowel dilatation or evidence of obstruction. No free air or opaque calculi. Right upper quadrant cholecystectomy clips. Osteopenia with degenerative changes of the spine and scoliosis. No acute osseous pathology. IMPRESSION: Nonobstructive bowel gas pattern. Electronically Signed   By: Vanetta Chou M.D.   On: 10/22/2024 15:22   DG Abd Portable 1V Result Date: 10/21/2024 EXAM: 1 VIEW XRAY OF THE ABDOMEN 10/21/2024 07:29:25 AM COMPARISON: 10/20/2024 CLINICAL HISTORY: Abdominal pain FINDINGS: BOWEL: Similar mild gaseous distension of both large and small bowel loops. Similar mild stool burden in the right colon. SOFT TISSUES: Cholecystectomy clips present. Atherosclerotic calcifications of the abdominal aorta. BONES: Lumbar scoliosis and degenerative changes. IMPRESSION: 1. No acute findings. 2. Unchanged appearance of mild gaseous distention of the large and small bowel loops without signs of high-grade mechanical obstruction. Electronically signed by: Waddell Calk MD MD  10/21/2024 07:42 AM EST RP Workstation: [id number]   CT ABDOMEN PELVIS W CONTRAST Result Date: 10/20/2024 EXAM: CT ABDOMEN AND PELVIS WITH CONTRAST 10/20/2024 09:39:29 AM TECHNIQUE: CT of the abdomen and pelvis was performed with the administration of 70 mL of iohexol  (OMNIPAQUE ) 300 MG/ML solution. Multiplanar reformatted images are provided for review. Automated exposure control, iterative reconstruction, and/or weight-based adjustment of the mA/kV was utilized to reduce the radiation dose to as low as reasonably achievable. COMPARISON: CT abdomen and pelvis 10/18/2024 and earlier. CLINICAL HISTORY:  89 year old female with progressive abdominal pain. Suspected bowel obstruction. FINDINGS: LOWER CHEST: Stable mild cardiomegaly. Stable mild lung base atelectasis and scarring. Stable mild elevation of the right hemidiaphragm, probable normal variant. LIVER: The liver is unremarkable. GALLBLADDER AND BILE DUCTS: Chronic cholecystectomy. Stable postoperative bile duct appearance. SPLEEN: No acute abnormality. PANCREAS: No acute abnormality. ADRENAL GLANDS: No acute abnormality. KIDNEYS, URETERS AND BLADDER: Symmetric renal contrast and excretion. No stones in the kidneys or ureters. No hydronephrosis. No perinephric or periureteral stranding. Mildly distended but otherwise unremarkable urinary bladder. GI AND BOWEL: Stomach and duodenum are decompressed. Pelvic floor laxity and progressive rectal stool ball (series 2 image 73). Upstream proximal rectum and distal sigmoid colon in the pelvis are mostly decompressed. Questionable generalized hyperenhancement of large and small bowel mucosa, including in the decompressed rectum on series 2 image 65. Descending colon is decompressed. Transverse colon is redundant. Similar redundant right colon with retained stool. Cecum mostly located in the pelvis. Decompressed terminal ileum. Appendix not identified. Widespread gas and fluid containing small bowel loops which are generally 2 cm diameter (approaching the upper limits of normal). No evidence of mechanical bowel obstruction. PERITONEUM AND RETROPERITONEUM: No discrete mesenteric inflammation. No pneumoperitoneum or free fluid. VASCULATURE: Aorta is normal in caliber. Advanced calcified atherosclerosis in the abdomen and pelvis. Major arterial structures and portal venous system are patent. LYMPH NODES: No lymphadenopathy. REPRODUCTIVE ORGANS: Chronically absent uterus, diminutive or absent ovaries. BONES AND SOFT TISSUES: Chronic spine degeneration and levoconvex scoliosis. No acute osseous abnormality. No focal soft  tissue abnormality. IMPRESSION: 1. Generalized enteritis and ileus are difficult to exclude, but no evidence of mechanical bowel obstruction. But highly redundant large bowel with retained stool, including in prolapsed rectum; consider developing fecal impaction. 2. No other acute or inflammatory process identified. Advanced atherosclerosis. Electronically signed by: Helayne Hurst MD MD 10/20/2024 09:50 AM EST RP Workstation: [id number]   DG Abd Portable 1 View Result Date: 10/20/2024 EXAM: 1 VIEW XRAY OF THE ABDOMEN 10/20/2024 08:33:52 AM COMPARISON: CT abdomen and pelvis 10/18/2024. CLINICAL HISTORY: 89 year old female. Constipation. FINDINGS: BOWEL: Mildly prominent small bowel loops throughout the abdomen is increased. Moderate colonic stool burden, including conspicuous stool ball in the rectum . SOFT TISSUES: Right upper quadrant surgical clips noted. Atherosclerotic plaque noted. BONES: Levoscoliosis and degenerative changes of the spine. No acute fracture. No acute osseous abnormality. IMPRESSION: 1. From recent CT, increased mild small-bowel prominence and moderate stool burden. Consider milder developing ileus in addition to rectal fecal impaction. Electronically signed by: Helayne Hurst MD MD 10/20/2024 08:48 AM EST RP Workstation: [id number]   CT ABDOMEN PELVIS W CONTRAST Result Date: 10/18/2024 EXAM: CT ABDOMEN AND PELVIS WITH CONTRAST 10/18/2024 10:11:27 AM TECHNIQUE: CT of the abdomen and pelvis was performed with the administration of 80 mL of iohexol  (OMNIPAQUE ) 300 MG/ML solution. Multiplanar reformatted images are provided for review. Automated exposure control, iterative reconstruction, and/or weight-based adjustment of the mA/kV was utilized to reduce the radiation dose to as low as reasonably  achievable. COMPARISON: 05/25/2022. 05/11/2017 CLINICAL HISTORY: Bowel obstruction suspected. FINDINGS: LOWER CHEST: Mild cardiomegaly with a right atrial predominance. Fibrolinear scarring and  subsegmental atelectasis in the lung bases. LIVER: Focal fatty infiltration adjacent to the falciform ligament. GALLBLADDER AND BILE DUCTS: Cholecystectomy. Mild to moderate intrahepatic and extrahepatic biliary ductal dilation, is likely related to the prior cholecystectomy. SPLEEN: No acute abnormality. PANCREAS: No acute abnormality. ADRENAL GLANDS: No acute abnormality. KIDNEYS, URETERS AND BLADDER: Similar appearance of multiple bilateral renal cysts. Unchanged appearance of the 1.1 cm left lower pole cyst containing a region of nodular enhancement. Fluid filled extrarenal pelves bilaterally, without hydronephrosis. No nephrolithiasis. No perinephric or periureteral stranding. Urinary bladder is distended without focal abnormality. GI AND BOWEL: Stomach demonstrates no acute abnormality. Diffuse descending and sigmoid colonic diverticulosis. Minimal fecal loading in the right colon. There is no bowel obstruction. PERITONEUM AND RETROPERITONEUM: No ascites. No free air. VASCULATURE: Aorta is normal in caliber. Diffuse aortoiliac atherosclerosis. LYMPH NODES: No lymphadenopathy. REPRODUCTIVE ORGANS: Hysterectomy. BONES AND SOFT TISSUES: Diffuse osteopenia. Multilevel degenerative disc disease of the spine. No acute osseous abnormality. No focal soft tissue abnormality. IMPRESSION: 1. No acute intraabdominal or pelvic abnormality. 2. Diffuse descending and sigmoid colonic diverticulosis. Minimal fecal loading in the right colon. No changes of acute diverticulitis. Electronically signed by: Rogelia Myers MD MD 10/18/2024 11:11 AM EST RP Workstation: [id number]    Microbiology: Results for orders placed or performed during the hospital encounter of 05/25/22  Urine Culture     Status: None   Collection Time: 05/25/22  3:57 AM   Specimen: Urine, Clean Catch  Result Value Ref Range Status   Specimen Description   Final    URINE, CLEAN CATCH Performed at [facility], [address] [place], [place] [address]    Special Requests   Final    NONE Performed at [facility], [address] [place], [place] [address]    Culture   Final    NO GROWTH Performed at [facility], [address], [place], [place] [address]    Report Status 05/26/2022 FINAL  Final    Labs: CBC: Recent Labs  Lab 10/18/24 0828 10/20/24 0806 10/23/24 0534  WBC 5.8 5.2 7.2  NEUTROABS  --   --  5.2  HGB 14.2 14.2 15.2*  HCT 43.6 42.8 46.4*  MCV 86.2 86.3 86.9  PLT 246 261 300   Basic Metabolic Panel: Recent Labs  Lab 10/18/24 0828 10/20/24 0806 10/21/24 0521 10/22/24 0505 10/23/24 0534  NA 128* 129* 133* 131* 133*  K 3.3* 4.3 3.0* 3.7 4.3  CL 89* 91* 96* 97* 94*  CO2 29 29 25 26 29   GLUCOSE 117* 114* 86 85 100*  BUN 9 6* 8 <5* 5*  CREATININE 0.61 0.54 0.58 0.54 0.73  CALCIUM  8.9 9.4 8.7* 8.6* 9.4  MG  --   --  2.2  --   --    Liver Function Tests: Recent Labs  Lab 10/18/24 0828 10/20/24 0806  AST 29 34  ALT 19 25  ALKPHOS 112 114  BILITOT 0.8 0.8  PROT 7.2 7.4  ALBUMIN 4.5 4.7   CBG: Recent Labs  Lab 10/21/24 2131  GLUCAP 99    Discharge time spent: 40 minutes.  Signed: Deliliah Room, MD [facility] 10/23/2024 "

## 2024-10-23 NOTE — Progress Notes (Signed)
 Brief Nutrition Note   RD received consult for assessment of nutrition requirements/ status. On initial visit, patient was receiving personal care. Case discussed with nurse tech, who reports plan for patient to discharge today.   Patient had discharge orders in chart. Received secure chat from RN, who reports patient and family are requesting to see RD prior to discharge. RD visited patient at 1130, however, patient was in the bathroom at time of visit. Discussed with daughter, who requests RD return at a later time as patient was in the bathroom and she desired patient to be present for conversation. This was communicated with RN, who reported RD would return within 30 minutes.   Spoke with patient and daughter at bedside. Daughter and patient anxious about discharge and plan of care for home. They have received discharge instructions, but were unclear about which medications to take. RD referred patient and family to discharge paper work and advised follow-up with PCP for further concerns. They report they have a PCP appointment scheduled approximately one week from today.   Daughter had multiple questions for RD, including: which medications to take at discharge, how to prevent dehydration and constipation, adequate hydration, appropriateness of electrolyte drinks, and labs values.   RD discussed how recent flu and decreased fluid and nutritional intake could have contributed to dehydration. Patient aims to drink about 45 ounces of water daily, but admitted to drink less during acute illness. RD discussed ways to ensure adequate hydration: discussed how all fluids (juices, milk, water, tea, etc) count as fluids and suggested keeping a water bottle on her to help remind her drink. Patient also shares that she spaces her fluid intake with her medications to ensure she gets enough water intake.   RD has been drinking about 6-8 ounces of Gatorade daily. RD discussed that this could be appropriate, but  limit this due to high sodium and sugar content. Reviewed labs results with patient; K was WDL.   In regards to medication regimen, RD deferred questions to MD/ PCP. Encouraged patient to follow-up with PCP or GI specialist for further questions and concerns.   Daughter and patient appreciative for visit. Questions answered to the best of RD ability and within scope of practice.   RD informed RN that education was completed. Plan to discharge home today.   Margery ORN, RD, LDN, CDCES Registered Dietitian III Certified Diabetes Care and Education Specialist If unable to reach this RD, please use RD Inpatient group chat on secure chat between hours of 8am-4 pm daily

## 2024-10-23 NOTE — Plan of Care (Signed)

## 2024-11-02 ENCOUNTER — Emergency Department (HOSPITAL_BASED_OUTPATIENT_CLINIC_OR_DEPARTMENT_OTHER)
Admission: EM | Admit: 2024-11-02 | Discharge: 2024-11-02 | Disposition: A | Discharge status: Home / Self Care | Attending: Emergency Medicine | Admitting: Emergency Medicine

## 2024-11-02 ENCOUNTER — Encounter (HOSPITAL_BASED_OUTPATIENT_CLINIC_OR_DEPARTMENT_OTHER): Payer: Self-pay

## 2024-11-02 DIAGNOSIS — R1084 Generalized abdominal pain: Secondary | ICD-10-CM | POA: Insufficient documentation

## 2024-11-02 DIAGNOSIS — I10 Essential (primary) hypertension: Secondary | ICD-10-CM | POA: Diagnosis not present

## 2024-11-02 DIAGNOSIS — I2510 Atherosclerotic heart disease of native coronary artery without angina pectoris: Secondary | ICD-10-CM | POA: Diagnosis not present

## 2024-11-02 DIAGNOSIS — Z85850 Personal history of malignant neoplasm of thyroid: Secondary | ICD-10-CM | POA: Diagnosis not present

## 2024-11-02 DIAGNOSIS — E039 Hypothyroidism, unspecified: Secondary | ICD-10-CM | POA: Diagnosis not present

## 2024-11-02 LAB — URINALYSIS, ROUTINE W REFLEX MICROSCOPIC
Bilirubin Urine: NEGATIVE
Glucose, UA: NEGATIVE mg/dL
Hgb urine dipstick: NEGATIVE
Ketones, ur: NEGATIVE mg/dL
Leukocytes,Ua: NEGATIVE
Nitrite: NEGATIVE
Protein, ur: NEGATIVE mg/dL
Specific Gravity, Urine: 1.015 (ref 1.005–1.030)
pH: 7 (ref 5.0–8.0)

## 2024-11-02 LAB — MAGNESIUM: Magnesium: 2.4 mg/dL (ref 1.7–2.4)

## 2024-11-02 LAB — CBC
HCT: 42.5 % (ref 36.0–46.0)
Hemoglobin: 13.8 g/dL (ref 12.0–15.0)
MCH: 28.4 pg (ref 26.0–34.0)
MCHC: 32.5 g/dL (ref 30.0–36.0)
MCV: 87.4 fL (ref 80.0–100.0)
Platelets: 322 10*3/uL (ref 150–400)
RBC: 4.86 MIL/uL (ref 3.87–5.11)
RDW: 14.3 % (ref 11.5–15.5)
WBC: 7.5 10*3/uL (ref 4.0–10.5)
nRBC: 0 % (ref 0.0–0.2)

## 2024-11-02 LAB — COMPREHENSIVE METABOLIC PANEL WITH GFR
ALT: 15 U/L (ref 0–44)
AST: 19 U/L (ref 15–41)
Albumin: 4.6 g/dL (ref 3.5–5.0)
Alkaline Phosphatase: 116 U/L (ref 38–126)
Anion gap: 10 (ref 5–15)
BUN: 14 mg/dL (ref 8–23)
CO2: 29 mmol/L (ref 22–32)
Calcium: 9.4 mg/dL (ref 8.9–10.3)
Chloride: 94 mmol/L — ABNORMAL LOW (ref 98–111)
Creatinine, Ser: 0.65 mg/dL (ref 0.44–1.00)
GFR, Estimated: >60 mL/min
Glucose, Bld: 104 mg/dL — ABNORMAL HIGH (ref 70–99)
Potassium: 4.2 mmol/L (ref 3.5–5.1)
Sodium: 133 mmol/L — ABNORMAL LOW (ref 135–145)
Total Bilirubin: 0.7 mg/dL (ref 0.0–1.2)
Total Protein: 7.7 g/dL (ref 6.5–8.1)

## 2024-11-02 LAB — LIPASE, BLOOD: Lipase: 60 U/L — ABNORMAL HIGH (ref 11–51)

## 2024-11-02 MED ORDER — SENNOSIDES-DOCUSATE SODIUM 8.6-50 MG PO TABS: 1.0000 | ORAL_TABLET | Freq: Every evening | ORAL | 0 refills | Status: AC | PRN

## 2024-11-02 MED ORDER — SODIUM CHLORIDE 0.9 % IV BOLUS
500.0000 mL | Freq: Once | INTRAVENOUS | Status: AC
  Administered 2024-11-02: 500 mL via INTRAVENOUS

## 2024-11-02 MED ORDER — DICYCLOMINE HCL 10 MG PO CAPS
10.0000 mg | ORAL_CAPSULE | Freq: Once | ORAL | Status: AC
  Administered 2024-11-02: 10 mg via ORAL
  Filled 2024-11-02: qty 1

## 2024-11-02 MED ORDER — DICYCLOMINE HCL 20 MG PO TABS: 20.0000 mg | ORAL_TABLET | Freq: Three times a day (TID) | ORAL | 0 refills | Status: AC | PRN

## 2024-11-02 NOTE — Discharge Instructions (Signed)

## 2024-11-02 NOTE — ED Notes (Signed)
 BSC placed at bedside. Pt aware a sample is needed and will provide when able.

## 2024-11-02 NOTE — ED Notes (Signed)
 Spoke with lab to add on Mag++ level
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
## 2024-11-02 NOTE — ED Triage Notes (Signed)
 Pt was seen on 01/11. Pt states that she is having abdominal pain. Pt was recently admitted to the hospital. Pt has been having nausea and decreased PO intake.

## 2024-11-11 ENCOUNTER — Ambulatory Visit
